# Patient Record
Sex: Female | Born: 1975 | Race: Black or African American | Hispanic: No | Marital: Married | Smoking: Never smoker
Health system: Southern US, Community
[De-identification: ages and names within clinical notes are randomized; demographics above are authoritative.]

## PROBLEM LIST (undated history)

## (undated) DIAGNOSIS — D649 Anemia, unspecified: Secondary | ICD-10-CM

## (undated) DIAGNOSIS — I2699 Other pulmonary embolism without acute cor pulmonale: Secondary | ICD-10-CM

## (undated) HISTORY — DX: Anemia, unspecified: D64.9

## (undated) HISTORY — DX: Other pulmonary embolism without acute cor pulmonale: I26.99

---

## 1998-05-01 ENCOUNTER — Emergency Department (HOSPITAL_COMMUNITY): Admission: EM | Admit: 1998-05-01 | Discharge: 1998-05-01 | Payer: Self-pay | Admitting: Emergency Medicine

## 1998-07-06 ENCOUNTER — Other Ambulatory Visit: Admission: RE | Admit: 1998-07-06 | Discharge: 1998-07-06 | Payer: Self-pay | Admitting: Obstetrics and Gynecology

## 1999-01-11 ENCOUNTER — Inpatient Hospital Stay (HOSPITAL_COMMUNITY): Admission: AD | Admit: 1999-01-11 | Discharge: 1999-01-14 | Payer: Self-pay | Admitting: Obstetrics & Gynecology

## 1999-01-15 ENCOUNTER — Encounter (HOSPITAL_COMMUNITY): Admission: RE | Admit: 1999-01-15 | Discharge: 1999-04-15 | Payer: Self-pay | Admitting: Obstetrics and Gynecology

## 1999-02-25 ENCOUNTER — Other Ambulatory Visit: Admission: RE | Admit: 1999-02-25 | Discharge: 1999-02-25 | Payer: Self-pay | Admitting: Obstetrics and Gynecology

## 2000-09-25 ENCOUNTER — Emergency Department (HOSPITAL_COMMUNITY): Admission: EM | Admit: 2000-09-25 | Discharge: 2000-09-25 | Payer: Self-pay | Admitting: Emergency Medicine

## 2001-05-01 ENCOUNTER — Inpatient Hospital Stay (HOSPITAL_COMMUNITY): Admission: AD | Admit: 2001-05-01 | Discharge: 2001-05-03 | Payer: Self-pay | Admitting: Gynecology

## 2002-02-06 ENCOUNTER — Emergency Department (HOSPITAL_COMMUNITY): Admission: EM | Admit: 2002-02-06 | Discharge: 2002-02-06 | Payer: Self-pay

## 2002-07-15 ENCOUNTER — Other Ambulatory Visit: Admission: RE | Admit: 2002-07-15 | Discharge: 2002-07-15 | Payer: Self-pay | Admitting: Gynecology

## 2002-11-08 ENCOUNTER — Emergency Department (HOSPITAL_COMMUNITY): Admission: EM | Admit: 2002-11-08 | Discharge: 2002-11-08 | Payer: Self-pay | Admitting: Emergency Medicine

## 2010-04-10 ENCOUNTER — Emergency Department (HOSPITAL_BASED_OUTPATIENT_CLINIC_OR_DEPARTMENT_OTHER): Admission: EM | Admit: 2010-04-10 | Discharge: 2010-04-10 | Payer: Self-pay | Admitting: Emergency Medicine

## 2010-04-10 ENCOUNTER — Ambulatory Visit: Payer: Self-pay | Admitting: Diagnostic Radiology

## 2016-06-18 ENCOUNTER — Emergency Department (HOSPITAL_COMMUNITY)
Admission: EM | Admit: 2016-06-18 | Discharge: 2016-06-18 | Disposition: A | Discharge status: Home / Self Care | Payer: Managed Care, Other (non HMO) | Attending: Emergency Medicine | Admitting: Emergency Medicine

## 2016-06-18 ENCOUNTER — Emergency Department (HOSPITAL_COMMUNITY): Payer: Managed Care, Other (non HMO)

## 2016-06-18 ENCOUNTER — Encounter (HOSPITAL_COMMUNITY): Payer: Self-pay | Admitting: Emergency Medicine

## 2016-06-18 DIAGNOSIS — W208XXA Other cause of strike by thrown, projected or falling object, initial encounter: Secondary | ICD-10-CM | POA: Insufficient documentation

## 2016-06-18 DIAGNOSIS — Z23 Encounter for immunization: Secondary | ICD-10-CM | POA: Diagnosis not present

## 2016-06-18 DIAGNOSIS — Y929 Unspecified place or not applicable: Secondary | ICD-10-CM | POA: Diagnosis not present

## 2016-06-18 DIAGNOSIS — S51811A Laceration without foreign body of right forearm, initial encounter: Secondary | ICD-10-CM | POA: Diagnosis present

## 2016-06-18 DIAGNOSIS — Y999 Unspecified external cause status: Secondary | ICD-10-CM | POA: Insufficient documentation

## 2016-06-18 DIAGNOSIS — Y939 Activity, unspecified: Secondary | ICD-10-CM | POA: Diagnosis not present

## 2016-06-18 DIAGNOSIS — T148XXA Other injury of unspecified body region, initial encounter: Secondary | ICD-10-CM

## 2016-06-18 MED ORDER — TETANUS-DIPHTH-ACELL PERTUSSIS 5-2.5-18.5 LF-MCG/0.5 IM SUSP
0.5000 mL | Freq: Once | INTRAMUSCULAR | Status: AC
  Administered 2016-06-18: 0.5 mL via INTRAMUSCULAR
  Filled 2016-06-18: qty 0.5

## 2016-06-18 MED ORDER — HYDROCODONE-ACETAMINOPHEN 5-325 MG PO TABS
2.0000 | ORAL_TABLET | Freq: Once | ORAL | Status: AC
  Administered 2016-06-18: 2 via ORAL
  Filled 2016-06-18: qty 2

## 2016-06-18 MED ORDER — BACITRACIN ZINC 500 UNIT/GM EX OINT
1.0000 "application " | TOPICAL_OINTMENT | Freq: Two times a day (BID) | CUTANEOUS | Status: DC
  Administered 2016-06-18: 1 via TOPICAL
  Filled 2016-06-18: qty 2.7

## 2016-06-18 NOTE — ED Triage Notes (Addendum)
Pt reports that she was trying to move a bedframe when it fell down on her right arm.  Bleeding is controlled by towel from home.  Wound is about 4 in long with visible adipose tissue.  Pt has limited ROM d/t pain but can wiggle fingers, full sensation, and strong pulses in her right hand.  Small shallow 1 in laceration on right hand.

## 2016-06-18 NOTE — ED Provider Notes (Signed)
[facility]-EMERGENCY DEPT Provider Note   CSN: [id number] Arrival date & time: 06/18/16  1849   By signing my name below, I, Lennie MuckleRyan Watts, attest that this documentation has been prepared under the direction and in the presence of Roxy Horsemanobert Kiona Blume, PA-C.  Electronically Signed: Lennie Muckleyan Watts, Scribe. 06/18/16. 7:43 PM.    History   Chief Complaint Chief Complaint  Patient presents with  . Extremity Laceration   HPI   HPI Comments: Carly Garrison is a 40 y.o. female who presents to the Emergency Department complaining of a wound on her right forearm. She was moving a bed frame and fell on her right arm. Has the bleeding controlled with a towel and says blood was not spraying from the wound. This happened within the last hour and she is in moderate pain.  History reviewed. No pertinent past medical history.  There are no active problems to display for this patient.   History reviewed. No pertinent surgical history.  OB History    No data available       Home Medications    Prior to Admission medications   Not on File    Family History No family history on file.  Social History Social History  Substance Use Topics  . Smoking status: Never Smoker  . Smokeless tobacco: Never Used  . Alcohol use Yes     Comment: wine, occasionally     Allergies   Review of patient's allergies indicates not on file.   Review of Systems Review of Systems  Skin:       Wound on right forearm, s/p fall     Physical Exam Updated Vital Signs BP 134/88 (BP Location: Left Arm)   Pulse 86   Temp 98.1 F (36.7 C) (Oral)   Resp 20   Ht 5\' 4"  (1.626 m)   Wt 167 lb (75.8 kg)   LMP 06/04/2016 Comment: 2 wks ago  SpO2 100%   BMI 28.67 kg/m   Physical Exam  Constitutional: She is oriented to person, place, and time. She appears well-developed and well-nourished.  HENT:  Head: Normocephalic and atraumatic.  Cardiovascular: Normal rate.   Pulmonary/Chest: Effort normal.  Neurological:  She is alert and oriented to person, place, and time.  Skin: Skin is warm and dry.  6cm linear avulsion injury to posterior right forearm without laceration, no bleeding, no foreign body  Psychiatric: She has a normal mood and affect.  Nursing note and vitals reviewed.    ED Treatments / Results  DIAGNOSTIC STUDIES: Oxygen Saturation is 100% on RA, normal by my interpretation.    COORDINATION OF CARE: 7:43 PM Discussed treatment plan with pt at bedside and pt agreed to plan.  Labs (all labs ordered are listed, but only abnormal results are displayed) Labs Reviewed - No data to display  EKG  EKG Interpretation None       Radiology Dg Forearm Right  Result Date: 06/18/2016 CLINICAL DATA:  Patient was moving furniture and dropped a bed frame on the right forearm EXAM: RIGHT FOREARM - 2 VIEW COMPARISON:  None. FINDINGS: There is no fracture identified. There is normal radial head alignment. Ossicle or old avulsion adjacent to the ulnar styloid. No radiopaque foreign body in the soft tissues. IMPRESSION: No acute osseous abnormality. Electronically Signed   By: Jasmine PangKim  Fujinaga M.D.   On: 06/18/2016 19:40    Procedures Procedures (including critical care time)  Medications Ordered in ED Medications  HYDROcodone-acetaminophen (NORCO/VICODIN) 5-325 MG per tablet 2 tablet (2 tablets  Oral Given 06/18/16 1932)  Tdap (BOOSTRIX) injection 0.5 mL (0.5 mLs Intramuscular Given 06/18/16 1930)     Initial Impression / Assessment and Plan / ED Course  I have reviewed the triage vital signs and the nursing notes.  Pertinent labs & imaging results that were available during my care of the patient were reviewed by me and considered in my medical decision making (see chart for details).  Clinical Course    Avulsion injury to skin, there is no laceration.  Will dress with bacitracin, Xeroform, gauze. Tetanus shot updated.    Final Clinical Impressions(s) / ED Diagnoses   Final  diagnoses:  Skin avulsion   I personally performed the services described in this documentation, which was scribed in my presence. The recorded information has been reviewed and is accurate.     New Prescriptions New Prescriptions   No medications on file     Roxy HorsemanRobert Anitta Tenny, PA-C 06/18/16 1957    Tilden FossaElizabeth Rees, MD 06/19/16 1501

## 2016-06-18 NOTE — ED Notes (Signed)
Attempted to use e-sign for discharge.  E-sign pad broken.

## 2019-11-14 ENCOUNTER — Ambulatory Visit: Payer: Managed Care, Other (non HMO) | Attending: Internal Medicine

## 2019-11-14 DIAGNOSIS — Z23 Encounter for immunization: Secondary | ICD-10-CM

## 2019-11-14 NOTE — Progress Notes (Signed)
   Covid-19 Vaccination Clinic  Name:  Carly Garrison    MRN: [id number] DOB: 1976/01/18  11/14/2019  [name] was observed post Covid-19 immunization for 15 minutes without incident. She was provided with Vaccine Information Sheet and instruction to access the V-Safe system.   [name] was instructed to call 911 with any severe reactions post vaccine: Marland Kitchen Difficulty breathing  . Swelling of face and throat  . A fast heartbeat  . A bad rash all over body  . Dizziness and weakness   Immunizations Administered    Name Date Dose VIS Date Route   Moderna COVID-19 Vaccine 11/14/2019  9:58 AM 0.5 mL 07/23/2019 Intramuscular   Manufacturer: Moderna   Lot: [id number]   NDC: 32992-426-83

## 2019-12-17 ENCOUNTER — Ambulatory Visit: Payer: Managed Care, Other (non HMO) | Attending: Internal Medicine

## 2019-12-17 DIAGNOSIS — Z23 Encounter for immunization: Secondary | ICD-10-CM

## 2019-12-17 NOTE — Progress Notes (Signed)
   Covid-19 Vaccination Clinic  Name:  Dlisa Barnwell    MRN: [id number] DOB: 1976/01/02  12/17/2019  Ms. Capers was observed post Covid-19 immunization for 15 minutes without incident. She was provided with Vaccine Information Sheet and instruction to access the V-Safe system.   Ms. Driskill was instructed to call 911 with any severe reactions post vaccine: Marland Kitchen Difficulty breathing  . Swelling of face and throat  . A fast heartbeat  . A bad rash all over body  . Dizziness and weakness   Immunizations Administered    Name Date Dose VIS Date Route   Moderna COVID-19 Vaccine 12/17/2019  9:36 AM 0.5 mL 07/2019 Intramuscular   Manufacturer: Moderna   Lot: [id number]   NDC: 00164-290-37

## 2020-02-11 ENCOUNTER — Other Ambulatory Visit: Payer: Self-pay

## 2020-02-11 ENCOUNTER — Ambulatory Visit
Admission: RE | Admit: 2020-02-11 | Discharge: 2020-02-11 | Disposition: A | Discharge status: Home / Self Care | Payer: BC Managed Care – PPO | Source: Ambulatory Visit

## 2020-02-11 ENCOUNTER — Telehealth: Payer: Self-pay | Admitting: Emergency Medicine

## 2020-02-11 ENCOUNTER — Emergency Department (HOSPITAL_COMMUNITY)
Admission: EM | Admit: 2020-02-11 | Discharge: 2020-02-11 | Disposition: A | Discharge status: Home / Self Care | Payer: BC Managed Care – PPO | Attending: Emergency Medicine | Admitting: Emergency Medicine

## 2020-02-11 DIAGNOSIS — Z5321 Procedure and treatment not carried out due to patient leaving prior to being seen by health care provider: Secondary | ICD-10-CM | POA: Insufficient documentation

## 2020-02-11 DIAGNOSIS — R42 Dizziness and giddiness: Secondary | ICD-10-CM | POA: Diagnosis not present

## 2020-02-11 NOTE — Telephone Encounter (Signed)
Late entry 11:44.    Attempted to contact patient using phone numbers in Epic.  Home number , had an answering machine.  Left a vague message to call this nurse No answer at work number No answer on cell phone.  "Onalee Hua" greeting on cell phone number, no message left.    Amy YU, PA notified of inability to contact patient.    1300 patient registered at [facility].  Spoke with patient about treatment modalities available and needed to rule out DVT.  Patient agreeable to recommendations.   Reports cell phone number she does not recognize, will delete.

## 2020-02-11 NOTE — ED Triage Notes (Signed)
Pt reports for a couple weeks had dizziness and when she crosses her legs and uncrosses them will have indents in her legs that last 10-15 seconds before go back to normal.

## 2020-11-10 ENCOUNTER — Ambulatory Visit (INDEPENDENT_AMBULATORY_CARE_PROVIDER_SITE_OTHER): Payer: BC Managed Care – PPO

## 2020-11-10 ENCOUNTER — Ambulatory Visit
Admission: EM | Admit: 2020-11-10 | Discharge: 2020-11-10 | Disposition: A | Discharge status: Home / Self Care | Payer: BC Managed Care – PPO | Attending: Physician Assistant | Admitting: Physician Assistant

## 2020-11-10 ENCOUNTER — Other Ambulatory Visit: Payer: Self-pay

## 2020-11-10 DIAGNOSIS — R1084 Generalized abdominal pain: Secondary | ICD-10-CM

## 2020-11-10 DIAGNOSIS — R079 Chest pain, unspecified: Secondary | ICD-10-CM

## 2020-11-10 NOTE — Discharge Instructions (Addendum)
You are scheduled for an ultrasound to evaluate your gallbladder

## 2020-11-10 NOTE — ED Triage Notes (Signed)
Pt presents with c/o pain under right rib cage and radiates to right flank, pt states pain is sharp at times, began 4 days ago, last bm a week ago. Pt states she has chronic constipation

## 2020-11-10 NOTE — ED Provider Notes (Signed)
[facility]    CSN: [id number] Arrival date & time: 11/10/20  7829      History   Chief Complaint Chief Complaint  Patient presents with  . Flank Pain    HPI Carly Garrison is a 45 y.o. female.   Pt complains of right chest pain.  Pain increases with a deep breath.   The history is provided by the patient. No language interpreter was used.  Flank Pain The current episode started yesterday. The problem occurs constantly. The problem has been gradually worsening. Nothing aggravates the symptoms. Nothing relieves the symptoms. She has tried nothing for the symptoms. The treatment provided no relief.    History reviewed. No pertinent past medical history.  There are no problems to display for this patient.   History reviewed. No pertinent surgical history.  OB History   No obstetric history on file.      Home Medications    Prior to Admission medications   Not on File    Family History Family History  Family history unknown: Yes    Social History Social History   Tobacco Use  . Smoking status: Never Smoker  . Smokeless tobacco: Never Used  Substance Use Topics  . Alcohol use: Yes    Comment: wine, occasionally  . Drug use: No     Allergies   Patient has no known allergies.   Review of Systems Review of Systems  Genitourinary: Positive for flank pain.  All other systems reviewed and are negative.    Physical Exam Triage Vital Signs ED Triage Vitals  Enc Vitals Group     BP 11/10/20 0828 128/68     Pulse Rate 11/10/20 0828 79     Resp 11/10/20 0828 18     Temp 11/10/20 0828 98.4 F (36.9 C)     Temp src --      SpO2 11/10/20 0828 98 %     Weight --      Height --      Head Circumference --      Peak Flow --      Pain Score 11/10/20 0826 8     Pain Loc --      Pain Edu? --      Excl. in GC? --    No data found.  Updated Vital Signs BP 128/68   Pulse 79   Temp 98.4 F (36.9 C)   Resp 18   LMP 10/19/2020   SpO2  98%   Visual Acuity Right Eye Distance:   Left Eye Distance:   Bilateral Distance:    Right Eye Near:   Left Eye Near:    Bilateral Near:     Physical Exam Vitals and nursing note reviewed.  Constitutional:      Appearance: She is well-developed.  HENT:     Head: Normocephalic.     Mouth/Throat:     Mouth: Mucous membranes are moist.  Cardiovascular:     Rate and Rhythm: Normal rate and regular rhythm.  Pulmonary:     Effort: Pulmonary effort is normal.  Abdominal:     General: There is no distension.  Musculoskeletal:        General: Normal range of motion.     Cervical back: Normal range of motion.  Skin:    General: Skin is warm.  Neurological:     General: No focal deficit present.     Mental Status: She is alert and oriented to person, place, and time.  Psychiatric:  Mood and Affect: Mood normal.      UC Treatments / Results  Labs (all labs ordered are listed, but only abnormal results are displayed) Labs Reviewed - No data to display  EKG   Radiology No results found.  Procedures Procedures (including critical care time)  Medications Ordered in UC Medications - No data to display  Initial Impression / Assessment and Plan / UC Course  I have reviewed the triage vital signs and the nursing notes.  Pertinent labs & imaging results that were available during my care of the patient were reviewed by me and considered in my medical decision making (see chart for details).     MDM:  Chest xray is normal  Pt is PERC negative.   Final Clinical Impressions(s) / UC Diagnoses   Final diagnoses:  Generalized abdominal pain   Discharge Instructions   None    ED Prescriptions    None     PDMP not reviewed this encounter.   Elson Areas, [place] 11/10/20 [phone]

## 2021-01-15 ENCOUNTER — Other Ambulatory Visit: Payer: Self-pay

## 2021-01-15 ENCOUNTER — Encounter (HOSPITAL_BASED_OUTPATIENT_CLINIC_OR_DEPARTMENT_OTHER): Payer: Self-pay | Admitting: *Deleted

## 2021-01-15 ENCOUNTER — Emergency Department (HOSPITAL_BASED_OUTPATIENT_CLINIC_OR_DEPARTMENT_OTHER): Payer: BC Managed Care – PPO

## 2021-01-15 ENCOUNTER — Inpatient Hospital Stay (HOSPITAL_BASED_OUTPATIENT_CLINIC_OR_DEPARTMENT_OTHER)
Admission: EM | Admit: 2021-01-15 | Discharge: 2021-01-18 | DRG: 176 | Disposition: A | Discharge status: Home / Self Care | Payer: BC Managed Care – PPO | Attending: Family Medicine | Admitting: Family Medicine

## 2021-01-15 DIAGNOSIS — I2694 Multiple subsegmental pulmonary emboli without acute cor pulmonale: Secondary | ICD-10-CM | POA: Diagnosis not present

## 2021-01-15 DIAGNOSIS — R0602 Shortness of breath: Secondary | ICD-10-CM | POA: Diagnosis not present

## 2021-01-15 DIAGNOSIS — I2699 Other pulmonary embolism without acute cor pulmonale: Secondary | ICD-10-CM | POA: Diagnosis present

## 2021-01-15 DIAGNOSIS — Z6834 Body mass index (BMI) 34.0-34.9, adult: Secondary | ICD-10-CM

## 2021-01-15 DIAGNOSIS — Z832 Family history of diseases of the blood and blood-forming organs and certain disorders involving the immune mechanism: Secondary | ICD-10-CM

## 2021-01-15 DIAGNOSIS — E66811 Obesity, class 1: Secondary | ICD-10-CM

## 2021-01-15 DIAGNOSIS — Z20822 Contact with and (suspected) exposure to covid-19: Secondary | ICD-10-CM | POA: Diagnosis present

## 2021-01-15 DIAGNOSIS — D72829 Elevated white blood cell count, unspecified: Secondary | ICD-10-CM

## 2021-01-15 DIAGNOSIS — E669 Obesity, unspecified: Secondary | ICD-10-CM | POA: Diagnosis present

## 2021-01-15 LAB — CBC WITH DIFFERENTIAL/PLATELET
Abs Immature Granulocytes: 0.03 10*3/uL (ref 0.00–0.07)
Basophils Absolute: 0 10*3/uL (ref 0.0–0.1)
Basophils Relative: 0 %
Eosinophils Absolute: 0 10*3/uL (ref 0.0–0.5)
Eosinophils Relative: 0 %
HCT: 38.2 % (ref 36.0–46.0)
Hemoglobin: 12.2 g/dL (ref 12.0–15.0)
Immature Granulocytes: 0 %
Lymphocytes Relative: 10 %
Lymphs Abs: 1.1 10*3/uL (ref 0.7–4.0)
MCH: 28.2 pg (ref 26.0–34.0)
MCHC: 31.9 g/dL (ref 30.0–36.0)
MCV: 88.4 fL (ref 80.0–100.0)
Monocytes Absolute: 0.6 10*3/uL (ref 0.1–1.0)
Monocytes Relative: 5 %
Neutro Abs: 9.6 10*3/uL — ABNORMAL HIGH (ref 1.7–7.7)
Neutrophils Relative %: 85 %
Platelets: 277 10*3/uL (ref 150–400)
RBC: 4.32 MIL/uL (ref 3.87–5.11)
RDW: 14.8 % (ref 11.5–15.5)
WBC: 11.3 10*3/uL — ABNORMAL HIGH (ref 4.0–10.5)
nRBC: 0 % (ref 0.0–0.2)

## 2021-01-15 LAB — LIPASE, BLOOD: Lipase: 16 U/L (ref 11–51)

## 2021-01-15 LAB — COMPREHENSIVE METABOLIC PANEL
ALT: 10 U/L (ref 0–44)
AST: 10 U/L — ABNORMAL LOW (ref 15–41)
Albumin: 4.2 g/dL (ref 3.5–5.0)
Alkaline Phosphatase: 62 U/L (ref 38–126)
Anion gap: 9 (ref 5–15)
BUN: 11 mg/dL (ref 6–20)
CO2: 22 mmol/L (ref 22–32)
Calcium: 9.2 mg/dL (ref 8.9–10.3)
Chloride: 103 mmol/L (ref 98–111)
Creatinine, Ser: 0.77 mg/dL (ref 0.44–1.00)
GFR, Estimated: >60 mL/min (ref >60)
Glucose, Bld: 103 mg/dL — ABNORMAL HIGH (ref 70–99)
Potassium: 4 mmol/L (ref 3.5–5.1)
Sodium: 134 mmol/L — ABNORMAL LOW (ref 135–145)
Total Bilirubin: 0.4 mg/dL (ref 0.3–1.2)
Total Protein: 8.5 g/dL — ABNORMAL HIGH (ref 6.5–8.1)

## 2021-01-15 LAB — TROPONIN I (HIGH SENSITIVITY)
Troponin I (High Sensitivity): <2 ng/L (ref <18)
Troponin I (High Sensitivity): <2 ng/L (ref <18)

## 2021-01-15 LAB — RESP PANEL BY RT-PCR (FLU A&B, COVID) ARPGX2
Influenza A by PCR: NEGATIVE
Influenza B by PCR: NEGATIVE
SARS Coronavirus 2 by RT PCR: NEGATIVE

## 2021-01-15 LAB — D-DIMER, QUANTITATIVE: D-Dimer, Quant: 1.8 ug/mL-FEU — ABNORMAL HIGH (ref 0.00–0.50)

## 2021-01-15 MED ORDER — ONDANSETRON HCL 4 MG/2ML IJ SOLN
4.0000 mg | Freq: Once | INTRAMUSCULAR | Status: AC
  Administered 2021-01-15: 4 mg via INTRAVENOUS
  Filled 2021-01-15: qty 2

## 2021-01-15 MED ORDER — ALBUTEROL SULFATE HFA 108 (90 BASE) MCG/ACT IN AERS: 2.0000 | INHALATION_SPRAY | RESPIRATORY_TRACT | Status: DC | PRN

## 2021-01-15 MED ORDER — HEPARIN SODIUM (PORCINE) 5000 UNIT/ML IJ SOLN: 4000.0000 [IU] | Freq: Once | INTRAMUSCULAR | Status: DC

## 2021-01-15 MED ORDER — HEPARIN (PORCINE) 25000 UT/250ML-% IV SOLN: 14.0000 [IU]/kg/h | INTRAVENOUS | Status: DC

## 2021-01-15 MED ORDER — HEPARIN (PORCINE) 25000 UT/250ML-% IV SOLN
1550.0000 [IU]/h | INTRAVENOUS | Status: AC
  Administered 2021-01-15 (×2): 1550 [IU]/h via INTRAVENOUS
  Administered 2021-01-16: 1350 [IU]/h via INTRAVENOUS
  Administered 2021-01-16: 1450 [IU]/h via INTRAVENOUS
  Administered 2021-01-17: 1550 [IU]/h via INTRAVENOUS
  Filled 2021-01-15 (×4): qty 250

## 2021-01-15 MED ORDER — HEPARIN BOLUS VIA INFUSION
5000.0000 [IU] | Freq: Once | INTRAVENOUS | Status: AC
  Administered 2021-01-15: 5000 [IU] via INTRAVENOUS

## 2021-01-15 MED ORDER — IOHEXOL 350 MG/ML SOLN
80.0000 mL | Freq: Once | INTRAVENOUS | Status: AC | PRN
  Administered 2021-01-15: 80 mL via INTRAVENOUS

## 2021-01-15 MED ORDER — HYDROMORPHONE HCL 1 MG/ML IJ SOLN
1.0000 mg | Freq: Once | INTRAMUSCULAR | Status: AC
  Administered 2021-01-15: 1 mg via INTRAVENOUS
  Filled 2021-01-15: qty 1

## 2021-01-15 NOTE — ED Notes (Signed)
Patient states woke up last night with acute Right side rib pain radiating to right back.  Hurts worse with deep breathing and movement.

## 2021-01-15 NOTE — ED Provider Notes (Signed)
[facility] EMERGENCY DEPT Provider Note   CSN: [id number] Arrival date & time: 01/15/21  1305     History Chief Complaint  Patient presents with  . Chest Pain  . Shortness of Breath    Carly Garrison is a 45 y.o. female.  Patient with acute onset of right-sided chest pain kind of underneath the right breast.  Kind of radiates around to the side.  It is worse with taking a deep breath.  Patient's never had anything like this before.  Past medical history noncontributory no history of diabetes hypertension.  States there is a family history of blood clots in the lungs.  Patient not on any birth control.  Patient denies any leg swelling        History reviewed. No pertinent past medical history.  There are no problems to display for this patient.   History reviewed. No pertinent surgical history.   OB History   No obstetric history on file.     Family History  Family history unknown: Yes    Social History   Tobacco Use  . Smoking status: Never Smoker  . Smokeless tobacco: Never Used  Substance Use Topics  . Alcohol use: Not Currently    Comment: wine, occasionally  . Drug use: No    Home Medications Prior to Admission medications   Not on File    Allergies    Patient has no known allergies.  Review of Systems   Review of Systems  Constitutional: Negative for chills and fever.  HENT: Negative for rhinorrhea and sore throat.   Eyes: Negative for visual disturbance.  Respiratory: Negative for cough and shortness of breath.   Cardiovascular: Positive for chest pain. Negative for palpitations and leg swelling.  Gastrointestinal: Negative for abdominal pain, diarrhea, nausea and vomiting.  Genitourinary: Negative for dysuria.  Musculoskeletal: Negative for back pain and neck pain.  Skin: Negative for rash.  Neurological: Negative for dizziness, light-headedness and headaches.  Hematological: Does not bruise/bleed easily.   Psychiatric/Behavioral: Negative for confusion.    Physical Exam Updated Vital Signs BP 131/86 (BP Location: Right Arm)   Pulse 92   Temp 98.5 F (36.9 C) (Oral)   Resp (!) 23   Ht 1.651 m (5\' 5" )   Wt 93 kg   SpO2 99%   BMI 34.11 kg/m   Physical Exam Vitals and nursing note reviewed.  Constitutional:      General: She is not in acute distress.    Appearance: Normal appearance. She is well-developed.  HENT:     Head: Normocephalic and atraumatic.  Eyes:     Extraocular Movements: Extraocular movements intact.     Conjunctiva/sclera: Conjunctivae normal.     Pupils: Pupils are equal, round, and reactive to light.  Cardiovascular:     Rate and Rhythm: Normal rate and regular rhythm.     Heart sounds: No murmur heard.   Pulmonary:     Effort: Pulmonary effort is normal. No respiratory distress.     Breath sounds: Normal breath sounds.     Comments: Tenderness to palpation under the right breast area. Chest:     Chest wall: Tenderness present.  Abdominal:     Palpations: Abdomen is soft.     Tenderness: There is no abdominal tenderness.     Comments: No tenderness to palpation in the abdomen in particular the right upper quadrant or epigastric area.  Musculoskeletal:        General: No swelling. Normal range of motion.  Cervical back: Normal range of motion and neck supple.  Skin:    General: Skin is warm and dry.     Capillary Refill: Capillary refill takes less than 2 seconds.  Neurological:     General: No focal deficit present.     Mental Status: She is alert and oriented to person, place, and time.     Cranial Nerves: No cranial nerve deficit.     Sensory: No sensory deficit.     Motor: No weakness.     ED Results / Procedures / Treatments   Labs (all labs ordered are listed, but only abnormal results are displayed) Labs Reviewed  CBC WITH DIFFERENTIAL/PLATELET - Abnormal; Notable for the following components:      Result Value   WBC 11.3 (*)     Neutro Abs 9.6 (*)    All other components within normal limits  COMPREHENSIVE METABOLIC PANEL - Abnormal; Notable for the following components:   Sodium 134 (*)    Glucose, Bld 103 (*)    Total Protein 8.5 (*)    AST 10 (*)    All other components within normal limits  D-DIMER, QUANTITATIVE - Abnormal; Notable for the following components:   D-Dimer, Quant 1.80 (*)    All other components within normal limits  RESP PANEL BY RT-PCR (FLU A&B, COVID) ARPGX2  LIPASE, BLOOD  TROPONIN I (HIGH SENSITIVITY)  TROPONIN I (HIGH SENSITIVITY)    EKG EKG Interpretation  Date/Time:  Friday Jan 15 2021 13:29:24 EDT Ventricular Rate:  94 PR Interval:  120 QRS Duration: 72 QT Interval:  336 QTC Calculation: 420 R Axis:   61 Text Interpretation: Normal sinus rhythm Normal ECG No previous ECGs available Confirmed by Vanetta Mulders [phone]) on 01/15/2021 6:45:47 PM   Radiology DG Chest 2 View  Result Date: 01/15/2021 CLINICAL DATA:  Shortness of breath, RIGHT rib pain, worsening symptoms EXAM: CHEST - 2 VIEW COMPARISON:  11/10/2020 FINDINGS: Normal heart size, mediastinal contours, and pulmonary vascularity. Lungs clear. No pleural effusion or pneumothorax. Bones unremarkable. IMPRESSION: Normal exam. Electronically Signed   By: Ulyses Southward M.D.   On: 01/15/2021 14:09   CT Angio Chest PE W/Cm &/Or Wo Cm  Result Date: 01/15/2021 CLINICAL DATA:  Right rib pain and short of breath EXAM: CT ANGIOGRAPHY CHEST WITH CONTRAST TECHNIQUE: Multidetector CT imaging of the chest was performed using the standard protocol during bolus administration of intravenous contrast. Multiplanar CT image reconstructions and MIPs were obtained to evaluate the vascular anatomy. CONTRAST:  58mL OMNIPAQUE IOHEXOL 350 MG/ML SOLN COMPARISON:  Chest x-ray 01/15/2021 FINDINGS: Cardiovascular: Satisfactory opacification of the pulmonary arteries to the segmental level. Small subsegmental emboli within the right upper lobe, series 5,  image 73. Several subsegmental emboli also evident within subsegmental right lower lobe pulmonary vessels, series 5, image 152. Aorta is nonaneurysmal. Cardiac size within normal limits. No pericardial effusion Mediastinum/Nodes: No enlarged mediastinal, hilar, or axillary lymph nodes. Thyroid gland, trachea, and esophagus demonstrate no significant findings. Lungs/Pleura: No pleural effusion or pneumothorax. Hazy right greater than left pulmonary densities at the bases, favor atelectasis, less likely infarct on the right side. Upper Abdomen: No acute abnormality. Musculoskeletal: No chest wall abnormality. No acute or significant osseous findings. Review of the MIP images confirms the above findings. IMPRESSION: 1. Positive for acute subsegmental emboli within the right upper and lower lobes. 2. Hazy right greater than left lower lobe airspace disease, favored to represent atelectasis, though small infarcts on the right could also be considered. Critical  Value/emergent results were called by telephone at the time of interpretation on 01/15/2021 at 8:37 pm to provider Vanetta Mulders , who verbally acknowledged these results. Electronically Signed   By: Jasmine Pang M.D.   On: 01/15/2021 20:37    Procedures Procedures   CRITICAL CARE Performed by: Vanetta Mulders Total critical care time: 45 minutes Critical care time was exclusive of separately billable procedures and treating other patients. Critical care was necessary to treat or prevent imminent or life-threatening deterioration. Critical care was time spent personally by me on the following activities: development of treatment plan with patient and/or surrogate as well as nursing, discussions with consultants, evaluation of patient's response to treatment, examination of patient, obtaining history from patient or surrogate, ordering and performing treatments and interventions, ordering and review of laboratory studies, ordering and review of  radiographic studies, pulse oximetry and re-evaluation of patient's condition.  Medications Ordered in ED Medications  heparin injection 4,000 Units (has no administration in time range)  heparin ADULT infusion 100 units/mL (25000 units/229mL) (has no administration in time range)  iohexol (OMNIPAQUE) 350 MG/ML injection 80 mL (80 mLs Intravenous Contrast Given 01/15/21 2014)    ED Course  I have reviewed the triage vital signs and the nursing notes.  Pertinent labs & imaging results that were available during my care of the patient were reviewed by me and considered in my medical decision making (see chart for details).    MDM Rules/Calculators/A&P                          Clinically suspicious for pleuritic chest pain.  PE was a possibility.  Patient without any hypoxia.  Oxygen saturations on room air 100%.  Respiratory rate up a little bit at 23 heart rate around 82.  Blood pressures have been 125 systolic.  White blood cell count 11,000.  Second troponin pending.  Complete metabolic panel without any significant liver function test abnormalities.  Lipase was normal.  Renal function is normal.  Initial troponin was less than 2.  D-dimer was 1.80.  Based on that CT angio chest was ordered.  Patient's regular chest x-ray without any acute findings.  EKG is normal sinus rhythm.  COVID testing is pending  Patient will be started on heparin with heparin bolus and then heparin drip.  Patient hemodynamically stable  CT angio was positive for acute segmental emboli within the right upper and lower lobes.  No evidence of any right heart strain.  We will start the patient on heparin.  Will contact hospitalist for admission.    Final Clinical Impression(s) / ED Diagnoses Final diagnoses:  Multiple subsegmental pulmonary emboli without acute cor pulmonale [facility])    Rx / DC Orders ED Discharge Orders    None       Vanetta Mulders, MD 01/15/21 2102

## 2021-01-15 NOTE — ED Triage Notes (Signed)
About 0200, pt was waken out of sleep with rt rib pain and shob. Pain has become worse. No other symptoms

## 2021-01-15 NOTE — Progress Notes (Signed)
ANTICOAGULATION CONSULT NOTE - Initial Consult  Pharmacy Consult for heparin Indication: pulmonary embolus  No Known Allergies  Patient Measurements: Height: 5\' 5"  (165.1 cm) Weight: 93 kg (205 lb) IBW/kg (Calculated) : 57 Heparin Dosing Weight: 93 kg   Vital Signs: Temp: 98.5 F (36.9 C) (05/27 1318) Temp Source: Oral (05/27 1318) BP: 131/86 (05/27 2000) Pulse Rate: 92 (05/27 2000)  Labs: Recent Labs    01/15/21 1913  HGB 12.2  HCT 38.2  PLT 277  CREATININE 0.77  TROPONINIHS <2    Estimated Creatinine Clearance: 102.2 mL/min (by C-G formula based on SCr of 0.77 mg/dL).   Medical History: History reviewed. No pertinent past medical history.  Medications:  (Not in a hospital admission)   Assessment: 38 YOF with acute subsegmental pulmonary emboli to start IV heparin. H/H and Plt wnl. SCr wnl  Goal of Therapy:  Heparin level 0.3-0.7 units/ml Monitor platelets by anticoagulation protocol: Yes   Plan:  -Heparin 5000 units IV bolus followed by heparin infusion at 1550 units/hr -F/u 6 hr HL -Monitor daily HL, CBC and s/s of bleeding   56, PharmD., BCPS, BCCCP Clinical Pharmacist Please refer to [facility] for unit-specific pharmacist

## 2021-01-16 ENCOUNTER — Observation Stay (HOSPITAL_BASED_OUTPATIENT_CLINIC_OR_DEPARTMENT_OTHER): Payer: BC Managed Care – PPO

## 2021-01-16 ENCOUNTER — Encounter (HOSPITAL_COMMUNITY): Payer: Self-pay | Admitting: Family Medicine

## 2021-01-16 DIAGNOSIS — R0602 Shortness of breath: Secondary | ICD-10-CM | POA: Diagnosis present

## 2021-01-16 DIAGNOSIS — Z20822 Contact with and (suspected) exposure to covid-19: Secondary | ICD-10-CM | POA: Diagnosis not present

## 2021-01-16 DIAGNOSIS — Z6834 Body mass index (BMI) 34.0-34.9, adult: Secondary | ICD-10-CM | POA: Diagnosis not present

## 2021-01-16 DIAGNOSIS — E669 Obesity, unspecified: Secondary | ICD-10-CM | POA: Diagnosis not present

## 2021-01-16 DIAGNOSIS — Z832 Family history of diseases of the blood and blood-forming organs and certain disorders involving the immune mechanism: Secondary | ICD-10-CM | POA: Diagnosis not present

## 2021-01-16 DIAGNOSIS — D72829 Elevated white blood cell count, unspecified: Secondary | ICD-10-CM

## 2021-01-16 DIAGNOSIS — I2699 Other pulmonary embolism without acute cor pulmonale: Secondary | ICD-10-CM | POA: Diagnosis not present

## 2021-01-16 DIAGNOSIS — I2694 Multiple subsegmental pulmonary emboli without acute cor pulmonale: Secondary | ICD-10-CM | POA: Diagnosis not present

## 2021-01-16 LAB — HEPARIN LEVEL (UNFRACTIONATED)
Heparin Unfractionated: 0.84 IU/mL — ABNORMAL HIGH (ref 0.30–0.70)
Heparin Unfractionated: 0.41 [IU]/mL (ref 0.30–0.70)
Heparin Unfractionated: 0.26 IU/mL — ABNORMAL LOW (ref 0.30–0.70)

## 2021-01-16 LAB — CBC
HCT: 35.9 % — ABNORMAL LOW (ref 36.0–46.0)
Hemoglobin: 11.4 g/dL — ABNORMAL LOW (ref 12.0–15.0)
MCH: 28.1 pg (ref 26.0–34.0)
MCHC: 31.8 g/dL (ref 30.0–36.0)
MCV: 88.6 fL (ref 80.0–100.0)
Platelets: 266 10*3/uL (ref 150–400)
RBC: 4.05 MIL/uL (ref 3.87–5.11)
RDW: 14.8 % (ref 11.5–15.5)
WBC: 11.2 10*3/uL — ABNORMAL HIGH (ref 4.0–10.5)
nRBC: 0 % (ref 0.0–0.2)

## 2021-01-16 LAB — HIV ANTIBODY (ROUTINE TESTING W REFLEX): HIV Screen 4th Generation wRfx: NONREACTIVE

## 2021-01-16 LAB — ANTITHROMBIN III: AntiThromb III Func: 97 % (ref 75–120)

## 2021-01-16 MED ORDER — HYDROMORPHONE HCL 1 MG/ML IJ SOLN
0.5000 mg | Freq: Once | INTRAMUSCULAR | Status: AC
  Administered 2021-01-16: 0.5 mg via INTRAVENOUS
  Filled 2021-01-16: qty 0.5

## 2021-01-16 MED ORDER — HEPARIN BOLUS VIA INFUSION
4000.0000 [IU] | Freq: Once | INTRAVENOUS | Status: AC
  Administered 2021-01-16: 4000 [IU] via INTRAVENOUS
  Filled 2021-01-16: qty 4000

## 2021-01-16 MED ORDER — ACETAMINOPHEN 650 MG RE SUPP: 650.0000 mg | Freq: Four times a day (QID) | RECTAL | Status: DC | PRN

## 2021-01-16 MED ORDER — ACETAMINOPHEN 325 MG PO TABS
650.0000 mg | ORAL_TABLET | Freq: Four times a day (QID) | ORAL | Status: DC | PRN
  Administered 2021-01-16 – 2021-01-17 (×2): 650 mg via ORAL
  Filled 2021-01-16 (×2): qty 2

## 2021-01-16 MED ORDER — KETOROLAC TROMETHAMINE 15 MG/ML IJ SOLN
15.0000 mg | Freq: Three times a day (TID) | INTRAMUSCULAR | Status: AC | PRN
  Administered 2021-01-16 (×3): 15 mg via INTRAVENOUS
  Filled 2021-01-16 (×3): qty 1

## 2021-01-16 NOTE — Plan of Care (Signed)
  Problem: Education: Goal: Knowledge of General Education information will improve Description: Including pain rating scale, medication(s)/side effects and non-pharmacologic comfort measures 01/16/2021 0302 by Llana Aliment D, RN Outcome: Progressing 01/16/2021 0301 by Llana Aliment D, RN Outcome: Progressing 01/16/2021 0301 by Annitta Jersey, RN Outcome: Progressing   Problem: Health Behavior/Discharge Planning: Goal: Ability to manage health-related needs will improve 01/16/2021 0302 by Annitta Jersey, RN Outcome: Progressing 01/16/2021 0301 by Llana Aliment D, RN Outcome: Progressing 01/16/2021 0301 by Llana Aliment D, RN Outcome: Progressing   Problem: Clinical Measurements: Goal: Ability to maintain clinical measurements within normal limits will improve 01/16/2021 0302 by Llana Aliment D, RN Outcome: Progressing 01/16/2021 0301 by Llana Aliment D, RN Outcome: Progressing 01/16/2021 0301 by Llana Aliment D, RN Outcome: Progressing Goal: Diagnostic test results will improve 01/16/2021 0302 by Llana Aliment D, RN Outcome: Progressing 01/16/2021 0301 by Llana Aliment D, RN Outcome: Progressing 01/16/2021 0301 by Llana Aliment D, RN Outcome: Progressing Goal: Respiratory complications will improve 01/16/2021 0302 by Llana Aliment D, RN Outcome: Progressing 01/16/2021 0301 by Llana Aliment D, RN Outcome: Progressing 01/16/2021 0301 by Annitta Jersey, RN Outcome: Progressing   Problem: Activity: Goal: Risk for activity intolerance will decrease 01/16/2021 0302 by Llana Aliment D, RN Outcome: Progressing 01/16/2021 0301 by Llana Aliment D, RN Outcome: Progressing 01/16/2021 0301 by Llana Aliment D, RN Outcome: Progressing   Problem: Pain Managment: Goal: General experience of comfort will improve 01/16/2021 0302 by Llana Aliment D, RN Outcome: Progressing 01/16/2021 0301 by Llana Aliment D, RN Outcome: Progressing 01/16/2021 0301 by Llana Aliment D,  RN Outcome: Progressing   Problem: Safety: Goal: Ability to remain free from injury will improve 01/16/2021 0302 by Llana Aliment D, RN Outcome: Progressing 01/16/2021 0301 by Llana Aliment D, RN Outcome: Progressing 01/16/2021 0301 by Llana Aliment D, RN Outcome: Progressing   Problem: Skin Integrity: Goal: Risk for impaired skin integrity will decrease 01/16/2021 0302 by Llana Aliment D, RN Outcome: Progressing 01/16/2021 0301 by Llana Aliment D, RN Outcome: Progressing 01/16/2021 0301 by Annitta Jersey, RN Outcome: Progressing

## 2021-01-16 NOTE — Care Plan (Signed)
This 45 years old female with no significant PMH other than obesity presents in the ED with c/o: acute onset of right-sided pleuritic chest pain associated with shortness of breath.  In the ED D-dimer was slightly elevated,  CT angiogram chest reveals acute segmental pulmonary emboli within the right upper and lower lobes.  No evidence of right heart strain.Patient was started on IV heparin.  Patient reports significant family history of blood clots in the sister and mother.  Patient was seen at bedside,  lying comfortably,  denies any chest pain or shortness of breath.  Will consider transitioning heparin to Eliquis in 24 to 48 hours.

## 2021-01-16 NOTE — Progress Notes (Signed)
Bilateral lower extremity venous duplex has been completed. Preliminary results can be found in CV Proc through chart review.   01/16/21 9:18 AM Olen Cordial RVT

## 2021-01-16 NOTE — H&P (Signed)
History and Physical    Carly Garrison SXJ:[id number] DOB: 09/01/1975 DOA: 01/15/2021  PCP: Patient, No Pcp Per (Inactive) Patient coming from: [facility]  Chief Complaint: Chest pain, shortness of breath  HPI: Carly Garrison is a 45 y.o. female with no significant past medical history other than obesity (BMI 34.11) presented to the ED with complaints of acute onset right-sided pleuritic chest pain and dyspnea.  In the ED, patient was slightly tachypneic but not hypoxic.  Afebrile and not tachycardic.  Not hypotensive.  Labs showing WBC 11.3, hemoglobin 12.2, platelet count 277K.  Sodium 134, potassium 4.0, chloride 103, bicarb 22, BUN 11, creatinine 0.7, glucose 103.  No elevation of lipase and LFTs.  High-sensitivity troponin negative x2.  D-dimer elevated at 1.8.  COVID and influenza PCR negative.  Chest x-ray showing no active disease.  CT angiogram chest showing acute segmental pulmonary emboli within the right upper and lower lobes.  No evidence of right heart strain.  Also showing hazy right greater than left lower lobe airspace disease suspicious for atelectasis versus small infarcts on the right.  Patient was started on IV heparin.  Patient reports experiencing acute right-sided pleuritic chest pain associated with dyspnea with started at 2 AM yesterday morning and woke her up from her sleep.  Denies fevers or cough.  Denies prior history of blood clots.  Denies history of leg pain or swelling.  She does not have any medical conditions and does not take any medications.  She does not smoke cigarettes.  She is not on an oral contraceptive pill.  No recent surgeries or immobility.  No recent travel.  Patient does state that both her father and sister have had blood clots.  Sister had blood clots twice when she was in her early 52s.  Review of Systems:  All systems reviewed and apart from history of presenting illness, are negative.  History reviewed. No pertinent past medical  history.  History reviewed. No pertinent surgical history.   reports that she has never smoked. She has never used smokeless tobacco. She reports previous alcohol use. She reports that she does not use drugs.  No Known Allergies  Family History  Problem Relation Age of Onset  . Clotting disorder Father   . Clotting disorder Sister     Prior to Admission medications   Not on File    Physical Exam: Vitals:   01/15/21 1930 01/15/21 2000 01/16/21 0100 01/16/21 0218  BP: 133/90 131/86 117/79 135/89  Pulse: 89 92 85 83  Resp: (!) 22 (!) 23 20 (!) 22  Temp:    98.7 F (37.1 C)  TempSrc:    Oral  SpO2: 100% 99% 95% 95%  Weight:      Height:        Physical Exam Constitutional:      General: She is not in acute distress. HENT:     Head: Normocephalic and atraumatic.  Eyes:     Extraocular Movements: Extraocular movements intact.     Conjunctiva/sclera: Conjunctivae normal.  Cardiovascular:     Rate and Rhythm: Normal rate and regular rhythm.     Pulses: Normal pulses.  Pulmonary:     Effort: Pulmonary effort is normal. No respiratory distress.     Breath sounds: Rales present. No wheezing.  Abdominal:     General: Bowel sounds are normal. There is no distension.     Palpations: Abdomen is soft.     Tenderness: There is no abdominal tenderness.  Musculoskeletal:  General: No swelling or tenderness.     Cervical back: Normal range of motion and neck supple.  Skin:    General: Skin is warm and dry.  Neurological:     General: No focal deficit present.     Mental Status: She is alert and oriented to person, place, and time.     Labs on Admission: I have personally reviewed following labs and imaging studies  CBC: Recent Labs  Lab 01/15/21 1913 01/16/21 0347  WBC 11.3* 11.2*  NEUTROABS 9.6*  --   HGB 12.2 11.4*  HCT 38.2 35.9*  MCV 88.4 88.6  PLT 277 266   Basic Metabolic Panel: Recent Labs  Lab 01/15/21 1913  NA 134*  K 4.0  CL 103  CO2 22   GLUCOSE 103*  BUN 11  CREATININE 0.77  CALCIUM 9.2   GFR: Estimated Creatinine Clearance: 102.2 mL/min (by C-G formula based on SCr of 0.77 mg/dL). Liver Function Tests: Recent Labs  Lab 01/15/21 1913  AST 10*  ALT 10  ALKPHOS 62  BILITOT 0.4  PROT 8.5*  ALBUMIN 4.2   Recent Labs  Lab 01/15/21 1913  LIPASE 16   No results for input(s): AMMONIA in the last 168 hours. Coagulation Profile: No results for input(s): INR, PROTIME in the last 168 hours. Cardiac Enzymes: No results for input(s): CKTOTAL, CKMB, CKMBINDEX, TROPONINI in the last 168 hours. BNP (last 3 results) No results for input(s): PROBNP in the last 8760 hours. HbA1C: No results for input(s): HGBA1C in the last 72 hours. CBG: No results for input(s): GLUCAP in the last 168 hours. Lipid Profile: No results for input(s): CHOL, HDL, LDLCALC, TRIG, CHOLHDL, LDLDIRECT in the last 72 hours. Thyroid Function Tests: No results for input(s): TSH, T4TOTAL, FREET4, T3FREE, THYROIDAB in the last 72 hours. Anemia Panel: No results for input(s): VITAMINB12, FOLATE, FERRITIN, TIBC, IRON, RETICCTPCT in the last 72 hours. Urine analysis: No results found for: COLORURINE, APPEARANCEUR, LABSPEC, PHURINE, GLUCOSEU, HGBUR, BILIRUBINUR, KETONESUR, PROTEINUR, UROBILINOGEN, NITRITE, LEUKOCYTESUR  Radiological Exams on Admission: DG Chest 2 View  Result Date: 01/15/2021 CLINICAL DATA:  Shortness of breath, RIGHT rib pain, worsening symptoms EXAM: CHEST - 2 VIEW COMPARISON:  11/10/2020 FINDINGS: Normal heart size, mediastinal contours, and pulmonary vascularity. Lungs clear. No pleural effusion or pneumothorax. Bones unremarkable. IMPRESSION: Normal exam. Electronically Signed   By: Ulyses Southward M.D.   On: 01/15/2021 14:09   CT Angio Chest PE W/Cm &/Or Wo Cm  Result Date: 01/15/2021 CLINICAL DATA:  Right rib pain and short of breath EXAM: CT ANGIOGRAPHY CHEST WITH CONTRAST TECHNIQUE: Multidetector CT imaging of the chest was  performed using the standard protocol during bolus administration of intravenous contrast. Multiplanar CT image reconstructions and MIPs were obtained to evaluate the vascular anatomy. CONTRAST:  45mL OMNIPAQUE IOHEXOL 350 MG/ML SOLN COMPARISON:  Chest x-ray 01/15/2021 FINDINGS: Cardiovascular: Satisfactory opacification of the pulmonary arteries to the segmental level. Small subsegmental emboli within the right upper lobe, series 5, image 73. Several subsegmental emboli also evident within subsegmental right lower lobe pulmonary vessels, series 5, image 152. Aorta is nonaneurysmal. Cardiac size within normal limits. No pericardial effusion Mediastinum/Nodes: No enlarged mediastinal, hilar, or axillary lymph nodes. Thyroid gland, trachea, and esophagus demonstrate no significant findings. Lungs/Pleura: No pleural effusion or pneumothorax. Hazy right greater than left pulmonary densities at the bases, favor atelectasis, less likely infarct on the right side. Upper Abdomen: No acute abnormality. Musculoskeletal: No chest wall abnormality. No acute or significant osseous findings. Review of the  MIP images confirms the above findings. IMPRESSION: 1. Positive for acute subsegmental emboli within the right upper and lower lobes. 2. Hazy right greater than left lower lobe airspace disease, favored to represent atelectasis, though small infarcts on the right could also be considered. Critical Value/emergent results were called by telephone at the time of interpretation on 01/15/2021 at 8:37 pm to provider Vanetta Mulders , who verbally acknowledged these results. Electronically Signed   By: Jasmine Pang M.D.   On: 01/15/2021 20:37    EKG: Independently reviewed.  Sinus rhythm, nonspecific T wave abnormality.  No prior tracing for comparison.  Assessment/Plan Principal Problem:   Acute pulmonary embolism (HCC) Active Problems:   Leukocytosis   Obesity   Acute segmental PE Patient presenting with complaints of  acute onset right-sided pleuritic chest pain and dyspnea.  D-dimer elevated and CT angiogram chest showing acute segmental pulmonary emboli within the right upper and lower lobes.  No evidence of right heart strain.  Also showing hazy right greater than left lower lobe airspace disease suspicious for atelectasis versus small infarcts on the right.  Patient is hemodynamically stable and not hypoxic.  Does report strong family history of blood clots including father and sister. -Continue IV heparin with plan to transition to DOAC prior to discharge.  Toradol as needed for pleuritic chest pain, Tylenol as needed.  Continuous pulse ox, supplemental oxygen as needed.  Bilateral lower extremity Dopplers ordered.  Hypercoagulable panel ordered.  Borderline leukocytosis Likely reactive.  No infectious signs or symptoms. -Continue to monitor  Obesity (BMI 34.11) -Encourage lifestyle modifications including healthy diet and exercise  DVT prophylaxis: Heparin Code Status: Full code Family Communication: No family at bedside. Disposition Plan: Status is: Observation  The patient remains OBS appropriate and will d/c before 2 midnights.  Dispo: The patient is from: Home              Anticipated d/c is to: Home              Patient currently is not medically stable to d/c.   Difficult to place patient No   Level of care: Level of care: Telemetry   The medical decision making on this patient was of high complexity and the patient is at high risk for clinical deterioration, therefore this is a level 3 visit.  John Giovanni MD [facility]  If 7PM-7AM, please contact night-coverage [email or web address]  01/16/2021, 5:36 AM

## 2021-01-16 NOTE — Progress Notes (Signed)
ANTICOAGULATION CONSULT NOTE - follow up  Pharmacy Consult for heparin Indication: pulmonary embolus  No Known Allergies  Patient Measurements: Height: 5\' 5"  (165.1 cm) Weight: 93 kg (205 lb) IBW/kg (Calculated) : 57 Heparin Dosing Weight: 93 kg   Vital Signs: Temp: 98.7 F (37.1 C) (05/28 0218) Temp Source: Oral (05/28 0218) BP: 135/89 (05/28 0218) Pulse Rate: 83 (05/28 0218)  Labs: Recent Labs    01/15/21 1913 01/15/21 2046 01/16/21 0347  HGB 12.2  --  11.4*  HCT 38.2  --  35.9*  PLT 277  --  266  HEPARINUNFRC  --   --  0.84*  CREATININE 0.77  --   --   TROPONINIHS <2 <2  --     Estimated Creatinine Clearance: 102.2 mL/min (by C-G formula based on SCr of 0.77 mg/dL).   Medical History: History reviewed. No pertinent past medical history.  Medications:  No medications prior to admission.    Assessment: 87 YOF with acute subsegmental pulmonary emboli to start IV heparin. H/H and Plt wnl. SCr wnl  01/16/2021 HL 0.84 supratherapeutic on 1550 units/hr Hgb 11.4, Plts WNL Per RN no bleeding  Goal of Therapy:  Heparin level 0.3-0.7 units/ml Monitor platelets by anticoagulation protocol: Yes   Plan:  -decrease heparin drip to 1350 units/hr -F/u 6 hr HL -Monitor daily HL, CBC and s/s of bleeding   01/18/2021 RPh 01/16/2021, 5:00 AM

## 2021-01-16 NOTE — Progress Notes (Addendum)
ANTICOAGULATION CONSULT NOTE - follow up  Pharmacy Consult for heparin Indication: pulmonary embolus  No Known Allergies  Patient Measurements: Height: 5\' 5"  (165.1 cm) Weight: 93 kg (205 lb) IBW/kg (Calculated) : 57 Heparin Dosing Weight: 93 kg   Vital Signs:    Labs: Recent Labs    01/15/21 1913 01/15/21 2046 01/16/21 0347 01/16/21 1135 01/16/21 1744  HGB 12.2  --  11.4*  --   --   HCT 38.2  --  35.9*  --   --   PLT 277  --  266  --   --   HEPARINUNFRC  --   --  0.84* 0.41 0.26*  CREATININE 0.77  --   --   --   --   TROPONINIHS <2 <2  --   --   --     Estimated Creatinine Clearance: 102.2 mL/min (by C-G formula based on SCr of 0.77 mg/dL).  Medications:  Medications Prior to Admission  Medication Sig Dispense Refill Last Dose  . naproxen sodium (ALEVE) 220 MG tablet Take 440 mg by mouth daily as needed (pain).   01/15/2021 at Unknown time  . Saccharomyces boulardii (PROBIOTIC) 250 MG CAPS Take 250 mg by mouth daily.   01/15/2021 at Unknown time    Assessment: 38 YOF with acute subsegmental pulmonary emboli to start IV heparin per Pharmacy dosing. No prior anticoagulation, although strong family Hx of blood clots.  01/16/2021  HL now therapeutic after reducing rate to 1350 units/hr  Hgb slightly low; Plt stable WNL  Per RN no bleeding or infusion issues  Goal of Therapy:  Heparin level 0.3-0.7 units/ml Monitor platelets by anticoagulation protocol: Yes   Plan:   Continue heparin drip at 1350 units/hr  Check confirmatory heparin level in 6 hr  Monitor daily HL, CBC and s/s of bleeding   F/u plans for long term anticoag (ie DOAC)  01/18/2021, PharmD, BCPS [phone] 01/16/2021, 12:29 PM   ADDENDUM:  Confirmatory heparin level now slightly subtherapeutic on 1350 units/hr  No infusion or bleeding issues per RN   Increase heparin rate to 1450 units/hr  Recheck 8 hr heparin level - suspect pt may need longer to reach steady state given  BMI  01/18/2021, PharmD, BCPS [phone] 01/16/2021, 7:26 PM

## 2021-01-16 NOTE — Progress Notes (Signed)
Pt with complaints of shortness of breath more than usual. Pt requested to stand for ease of breathing.  Assisted to standing position with 2 assist.  Pt placed on 2lnc with humidification.  Prior to O2 application, O2 sats were 94-95%.  Currently with O2, O2 sats 94-95%.  Elevated respirations d/t pt shallow breathing. Pt has toradol for pain, seems to not be effective.  LIP to be notified for other alternatives.  Meanwhile pt returned back to bed and HOB elevated to 90 degrees and use of 4 pillows for ease of breathing.  Warm back put to pt right back. Will continue to monitor and note changes.

## 2021-01-16 NOTE — Plan of Care (Signed)
  Problem: Education: Goal: Knowledge of General Education information will improve Description: Including pain rating scale, medication(s)/side effects and non-pharmacologic comfort measures 01/16/2021 2323 by Llana Aliment D, RN Outcome: Progressing 01/16/2021 2323 by Annitta Jersey, RN Outcome: Progressing   Problem: Health Behavior/Discharge Planning: Goal: Ability to manage health-related needs will improve 01/16/2021 2323 by Annitta Jersey, RN Outcome: Progressing 01/16/2021 2323 by Llana Aliment D, RN Outcome: Progressing   Problem: Clinical Measurements: Goal: Ability to maintain clinical measurements within normal limits will improve 01/16/2021 2323 by Llana Aliment D, RN Outcome: Progressing 01/16/2021 2323 by Llana Aliment D, RN Outcome: Progressing Goal: Diagnostic test results will improve 01/16/2021 2323 by Llana Aliment D, RN Outcome: Progressing 01/16/2021 2323 by Llana Aliment D, RN Outcome: Progressing Goal: Respiratory complications will improve 01/16/2021 2323 by Llana Aliment D, RN Outcome: Progressing 01/16/2021 2323 by Llana Aliment D, RN Outcome: Progressing   Problem: Activity: Goal: Risk for activity intolerance will decrease 01/16/2021 2323 by Llana Aliment D, RN Outcome: Progressing 01/16/2021 2323 by Llana Aliment D, RN Outcome: Progressing   Problem: Pain Managment: Goal: General experience of comfort will improve 01/16/2021 2323 by Annitta Jersey, RN Outcome: Progressing 01/16/2021 2323 by Llana Aliment D, RN Outcome: Progressing   Problem: Safety: Goal: Ability to remain free from injury will improve 01/16/2021 2323 by Annitta Jersey, RN Outcome: Progressing 01/16/2021 2323 by Llana Aliment D, RN Outcome: Progressing   Problem: Skin Integrity: Goal: Risk for impaired skin integrity will decrease 01/16/2021 2323 by Llana Aliment D, RN Outcome: Progressing 01/16/2021 2323 by Annitta Jersey, RN Outcome: Progressing

## 2021-01-17 DIAGNOSIS — I2699 Other pulmonary embolism without acute cor pulmonale: Secondary | ICD-10-CM

## 2021-01-17 DIAGNOSIS — Z6834 Body mass index (BMI) 34.0-34.9, adult: Secondary | ICD-10-CM | POA: Diagnosis not present

## 2021-01-17 DIAGNOSIS — Z20822 Contact with and (suspected) exposure to covid-19: Secondary | ICD-10-CM | POA: Diagnosis present

## 2021-01-17 DIAGNOSIS — Z832 Family history of diseases of the blood and blood-forming organs and certain disorders involving the immune mechanism: Secondary | ICD-10-CM | POA: Diagnosis not present

## 2021-01-17 DIAGNOSIS — E669 Obesity, unspecified: Secondary | ICD-10-CM | POA: Diagnosis present

## 2021-01-17 DIAGNOSIS — R0602 Shortness of breath: Secondary | ICD-10-CM | POA: Diagnosis present

## 2021-01-17 DIAGNOSIS — D72829 Elevated white blood cell count, unspecified: Secondary | ICD-10-CM | POA: Diagnosis present

## 2021-01-17 DIAGNOSIS — I2694 Multiple subsegmental pulmonary emboli without acute cor pulmonale: Secondary | ICD-10-CM | POA: Diagnosis present

## 2021-01-17 LAB — CBC
HCT: 33.1 % — ABNORMAL LOW (ref 36.0–46.0)
Hemoglobin: 10.4 g/dL — ABNORMAL LOW (ref 12.0–15.0)
MCH: 28.1 pg (ref 26.0–34.0)
MCHC: 31.4 g/dL (ref 30.0–36.0)
MCV: 89.5 fL (ref 80.0–100.0)
Platelets: 261 K/uL (ref 150–400)
RBC: 3.7 MIL/uL — ABNORMAL LOW (ref 3.87–5.11)
RDW: 14.8 % (ref 11.5–15.5)
WBC: 10.7 K/uL — ABNORMAL HIGH (ref 4.0–10.5)
nRBC: 0 % (ref 0.0–0.2)

## 2021-01-17 LAB — BETA-2-GLYCOPROTEIN I ABS, IGG/M/A
Beta-2 Glyco I IgG: <9 GPI IgG units (ref 0–20)
Beta-2-Glycoprotein I IgA: <9 GPI IgA units (ref 0–25)
Beta-2-Glycoprotein I IgM: 9 GPI IgM units (ref 0–32)

## 2021-01-17 LAB — HEPARIN LEVEL (UNFRACTIONATED)
Heparin Unfractionated: 0.44 IU/mL (ref 0.30–0.70)
Heparin Unfractionated: 0.29 [IU]/mL — ABNORMAL LOW (ref 0.30–0.70)

## 2021-01-17 MED ORDER — KETOROLAC TROMETHAMINE 15 MG/ML IJ SOLN
INTRAMUSCULAR | Status: AC
  Administered 2021-01-17: 15 mg via INTRAVENOUS
  Filled 2021-01-17: qty 1

## 2021-01-17 MED ORDER — HEPARIN BOLUS VIA INFUSION
4000.0000 [IU] | Freq: Once | INTRAVENOUS | Status: AC
  Administered 2021-01-17: 4000 [IU] via INTRAVENOUS
  Filled 2021-01-17: qty 4000

## 2021-01-17 MED ORDER — APIXABAN 5 MG PO TABS: 5.0000 mg | ORAL_TABLET | Freq: Two times a day (BID) | ORAL | Status: DC

## 2021-01-17 MED ORDER — APIXABAN 5 MG PO TABS
10.0000 mg | ORAL_TABLET | Freq: Two times a day (BID) | ORAL | Status: DC
  Administered 2021-01-17 – 2021-01-18 (×2): 10 mg via ORAL
  Filled 2021-01-17: qty 4
  Filled 2021-01-17: qty 2

## 2021-01-17 MED ORDER — KETOROLAC TROMETHAMINE 15 MG/ML IJ SOLN: 15.0000 mg | Freq: Four times a day (QID) | INTRAMUSCULAR | Status: DC | PRN

## 2021-01-17 NOTE — Progress Notes (Signed)
ANTICOAGULATION CONSULT NOTE - follow up  Pharmacy Consult for heparin Indication: pulmonary embolus  No Known Allergies  Patient Measurements: Height: 5\' 5"  (165.1 cm) Weight: 93 kg (205 lb) IBW/kg (Calculated) : 57 Heparin Dosing Weight: 93 kg   Vital Signs: Temp: 98.4 F (36.9 C) (05/29 0519) Temp Source: Oral (05/29 0519) BP: 125/80 (05/29 0519) Pulse Rate: 71 (05/29 0519)  Labs: Recent Labs    01/15/21 1913 01/15/21 2046 01/16/21 0347 01/16/21 1135 01/16/21 1744 01/17/21 0347 01/17/21 0937  HGB 12.2  --  11.4*  --   --  10.4*  --   HCT 38.2  --  35.9*  --   --  33.1*  --   PLT 277  --  266  --   --  261  --   HEPARINUNFRC  --   --  0.84*   < > 0.26* 0.44 0.29*  CREATININE 0.77  --   --   --   --   --   --   TROPONINIHS <2 <2  --   --   --   --   --    < > = values in this interval not displayed.    Estimated Creatinine Clearance: 102.2 mL/min (by C-G formula based on SCr of 0.77 mg/dL).  Medications:  Medications Prior to Admission  Medication Sig Dispense Refill Last Dose  . naproxen sodium (ALEVE) 220 MG tablet Take 440 mg by mouth daily as needed (pain).   01/15/2021 at Unknown time  . Saccharomyces boulardii (PROBIOTIC) 250 MG CAPS Take 250 mg by mouth daily.   01/15/2021 at Unknown time    Assessment: 62 YOF with acute subsegmental pulmonary emboli to start IV heparin per Pharmacy dosing. No prior anticoagulation, although strong family Hx of blood clots.  01/17/2021  HL again subtherapeutic after previously being at goal - suspect body habitus may be leading to longer than normal distribution times for heparin boluses, making post-bolus levels appear higher than actual.  Hgb down to 10.4 Plt stable WNL  Per RN no bleeding or infusion issues  Goal of Therapy:  Heparin level 0.3-0.7 units/ml Monitor platelets by anticoagulation protocol: Yes   Plan:   Increase heparin infusion to 1550 units/hr - may require more than this, but since initially  supratherapeutic on this rate will not advance beyond this until levels indicate otherwise  Check confirmatory heparin level in 8 hr  Monitor daily HL, CBC and s/s of bleeding   F/u plans for long term anticoag (ie DOAC)  01/19/2021, PharmD, BCPS [phone] 01/17/2021, 12:30 PM

## 2021-01-17 NOTE — Discharge Instructions (Signed)
Information on my medicine - ELIQUIS (apixaban)  This medication education was reviewed with me or my healthcare representative as part of my discharge preparation.  The pharmacist that spoke with me during my hospital stay was:  Christmas Faraci A, RPH  Why was Eliquis prescribed for you? Eliquis was prescribed to treat blood clots that may have been found in the veins of your legs (deep vein thrombosis) or in your lungs (pulmonary embolism) and to reduce the risk of them occurring again.  What do You need to know about Eliquis ? The starting dose is 10 mg (two 5 mg tablets) taken TWICE daily for the FIRST SEVEN (7) DAYS, then on (enter date)  01/24/21  the dose is reduced to ONE 5 mg tablet taken TWICE daily.  Eliquis may be taken with or without food.   Try to take the dose about the same time in the morning and in the evening. If you have difficulty swallowing the tablet whole please discuss with your pharmacist how to take the medication safely.  Take Eliquis exactly as prescribed and DO NOT stop taking Eliquis without talking to the doctor who prescribed the medication.  Stopping may increase your risk of developing a new blood clot.  Refill your prescription before you run out.  After discharge, you should have regular check-up appointments with your healthcare provider that is prescribing your Eliquis.    What do you do if you miss a dose? If a dose of ELIQUIS is not taken at the scheduled time, take it as soon as possible on the same day and twice-daily administration should be resumed. The dose should not be doubled to make up for a missed dose.  Important Safety Information A possible side effect of Eliquis is bleeding. You should call your healthcare provider right away if you experience any of the following: ? Bleeding from an injury or your nose that does not stop. ? Unusual colored urine (red or dark brown) or unusual colored stools (red or black). ? Unusual bruising for  unknown reasons. ? A serious fall or if you hit your head (even if there is no bleeding).  Some medicines may interact with Eliquis and might increase your risk of bleeding or clotting while on Eliquis. To help avoid this, consult your healthcare provider or pharmacist prior to using any new prescription or non-prescription medications, including herbals, vitamins, non-steroidal anti-inflammatory drugs (NSAIDs) and supplements.  This website has more information on Eliquis (apixaban): http://www.eliquis.com/eliquis/home

## 2021-01-17 NOTE — Plan of Care (Signed)
  Problem: Education: Goal: Knowledge of General Education information will improve Description: Including pain rating scale, medication(s)/side effects and non-pharmacologic comfort measures Outcome: Progressing   Problem: Health Behavior/Discharge Planning: Goal: Ability to manage health-related needs will improve Outcome: Progressing   Problem: Clinical Measurements: Goal: Ability to maintain clinical measurements within normal limits will improve Outcome: Progressing Goal: Diagnostic test results will improve Outcome: Progressing Goal: Respiratory complications will improve Outcome: Progressing   Problem: Activity: Goal: Risk for activity intolerance will decrease Outcome: Progressing   Problem: Pain Managment: Goal: General experience of comfort will improve Outcome: Progressing   Problem: Safety: Goal: Ability to remain free from injury will improve Outcome: Progressing   Problem: Skin Integrity: Goal: Risk for impaired skin integrity will decrease Outcome: Progressing

## 2021-01-17 NOTE — Progress Notes (Signed)
PROGRESS NOTE    Carly Garrison  ZOX:[id number] DOB: 07-29-1976 DOA: 01/15/2021 PCP: Patient, No Pcp Per (Inactive)   Brief Narrative:  This 45 years old female with no significant PMH other than obesity presents in the ED with c/o: acute onset of right-sided pleuritic chest pain associated with shortness of breath.  In the ED D-dimer was slightly elevated,  CT angiogram chest reveals acute segmental pulmonary emboli within the right upper and lower lobes.  No evidence of right heart strain. Patient was started on IV heparin. Patient reports significant family history of blood clots in the sister and mother. Will consider transitioning heparin to Eliquis in 24 to 48 hours.  Assessment & Plan:   Principal Problem:   Acute pulmonary embolism (HCC) Active Problems:   Leukocytosis   Obesity  Acute segmental pulmonary embolism: Patient presented with acute onset of right-sided pleuritic chest pain associated with shortness of breath. D-dimer was elevated.  CT angiogram chest showing acute segmental pulmonary embolism within the right upper and lower lobes. No evidence of right heart strain noted. Patient is hemodynamically stable. Continue IV heparin with the plan to transition to DOAC prior to discharge. Continue tramadol as needed for pain Continue pulse oximetry. Venous duplex negative for DVT. Follow-up hypercoagulable work-up.    DVT prophylaxis: Heparin Code Status: Full code. Family Communication: Mother at bedside. Disposition Plan:   Status is: Inpatient  Remains inpatient appropriate because:Inpatient level of care appropriate due to severity of illness   Dispo: The patient is from: Home              Anticipated d/c is to: Home              Patient currently is not medically stable to d/c.   Difficult to place patient No  Consultants:   None  Procedures:  Antimicrobials:  Anti-infectives (From admission, onward)   None     Subjective: Patient was seen and  examined at bedside.  Overnight events noted.   She was short of breath yesterday but O2 saturation remained stable.   She is lying comfortably in the morning.  Objective: Vitals:   01/16/21 0218 01/16/21 0628 01/16/21 2111 01/17/21 0519  BP: 135/89 126/85 112/79 125/80  Pulse: 83 86 85 71  Resp: (!) 22 20 (!) 24 20  Temp: 98.7 F (37.1 C) 98.7 F (37.1 C) 98.7 F (37.1 C) 98.4 F (36.9 C)  TempSrc: Oral Oral Oral Oral  SpO2: 95% 96% 98% 96%  Weight:      Height:        Intake/Output Summary (Last 24 hours) at 01/17/2021 1409 Last data filed at 01/17/2021 1000 Gross per 24 hour  Intake 963.25 ml  Output --  Net 963.25 ml   Filed Weights   01/15/21 1321  Weight: 93 kg    Examination:  General exam: Appears calm and comfortable, not in any acute distress. Respiratory system: Clear to auscultation. Respiratory effort normal. Cardiovascular system: S1 & S2 heard, RRR. No JVD, murmurs, rubs, gallops or clicks. No pedal edema. Gastrointestinal system: Abdomen is nondistended, soft and nontender. No organomegaly or masses felt.  Normal bowel sounds heard. Central nervous system: Alert and oriented. No focal neurological deficits. Extremities: Symmetric 5 x 5 power.  No edema, no cyanosis, no clubbing. Skin: No rashes, lesions or ulcers Psychiatry: Judgement and insight appear normal. Mood & affect appropriate.     Data Reviewed: I have personally reviewed following labs and imaging studies  CBC: Recent Labs  Lab 01/15/21 1913 01/16/21 0347 01/17/21 0347  WBC 11.3* 11.2* 10.7*  NEUTROABS 9.6*  --   --   HGB 12.2 11.4* 10.4*  HCT 38.2 35.9* 33.1*  MCV 88.4 88.6 89.5  PLT 277 266 261   Basic Metabolic Panel: Recent Labs  Lab 01/15/21 1913  NA 134*  K 4.0  CL 103  CO2 22  GLUCOSE 103*  BUN 11  CREATININE 0.77  CALCIUM 9.2   GFR: Estimated Creatinine Clearance: 102.2 mL/min (by C-G formula based on SCr of 0.77 mg/dL). Liver Function Tests: Recent Labs   Lab 01/15/21 1913  AST 10*  ALT 10  ALKPHOS 62  BILITOT 0.4  PROT 8.5*  ALBUMIN 4.2   Recent Labs  Lab 01/15/21 1913  LIPASE 16   No results for input(s): AMMONIA in the last 168 hours. Coagulation Profile: No results for input(s): INR, PROTIME in the last 168 hours. Cardiac Enzymes: No results for input(s): CKTOTAL, CKMB, CKMBINDEX, TROPONINI in the last 168 hours. BNP (last 3 results) No results for input(s): PROBNP in the last 8760 hours. HbA1C: No results for input(s): HGBA1C in the last 72 hours. CBG: No results for input(s): GLUCAP in the last 168 hours. Lipid Profile: No results for input(s): CHOL, HDL, LDLCALC, TRIG, CHOLHDL, LDLDIRECT in the last 72 hours. Thyroid Function Tests: No results for input(s): TSH, T4TOTAL, FREET4, T3FREE, THYROIDAB in the last 72 hours. Anemia Panel: No results for input(s): VITAMINB12, FOLATE, FERRITIN, TIBC, IRON, RETICCTPCT in the last 72 hours. Sepsis Labs: No results for input(s): PROCALCITON, LATICACIDVEN in the last 168 hours.  Recent Results (from the past 240 hour(s))  Resp Panel by RT-PCR (Flu A&B, Covid) Nasopharyngeal Swab     Status: None   Collection Time: 01/15/21  9:01 PM   Specimen: Nasopharyngeal Swab; Nasopharyngeal(NP) swabs in vial transport medium  Result Value Ref Range Status   SARS Coronavirus 2 by RT PCR NEGATIVE NEGATIVE Final    Comment: (NOTE) SARS-CoV-2 target nucleic acids are NOT DETECTED.  The SARS-CoV-2 RNA is generally detectable in upper respiratory specimens during the acute phase of infection. The lowest concentration of SARS-CoV-2 viral copies this assay can detect is 138 copies/mL. A negative result does not preclude SARS-Cov-2 infection and should not be used as the sole basis for treatment or other patient management decisions. A negative result may occur with  improper specimen collection/handling, submission of specimen other than nasopharyngeal swab, presence of viral mutation(s)  within the areas targeted by this assay, and inadequate number of viral copies(<138 copies/mL). A negative result must be combined with clinical observations, patient history, and epidemiological information. The expected result is Negative.  Fact Sheet for Patients:  BloggerCourse.com  Fact Sheet for Healthcare Providers:  SeriousBroker.it  This test is no t yet approved or cleared by the [place] FDA and  has been authorized for detection and/or diagnosis of SARS-CoV-2 by FDA under an Emergency Use Authorization (EUA). This EUA will remain  in effect (meaning this test can be used) for the duration of the COVID-19 declaration under Section 564(b)(1) of the Act, 21 U.S.C.section 360bbb-3(b)(1), unless the authorization is terminated  or revoked sooner.       Influenza A by PCR NEGATIVE NEGATIVE Final   Influenza B by PCR NEGATIVE NEGATIVE Final    Comment: (NOTE) The Xpert Xpress SARS-CoV-2/FLU/RSV plus assay is intended as an aid in the diagnosis of influenza from Nasopharyngeal swab specimens and should not be used as a sole basis for treatment. Nasal  washings and aspirates are unacceptable for Xpert Xpress SARS-CoV-2/FLU/RSV testing.  Fact Sheet for Patients: BloggerCourse.com  Fact Sheet for Healthcare Providers: SeriousBroker.it  This test is not yet approved or cleared by the [place] FDA and has been authorized for detection and/or diagnosis of SARS-CoV-2 by FDA under an Emergency Use Authorization (EUA). This EUA will remain in effect (meaning this test can be used) for the duration of the COVID-19 declaration under Section 564(b)(1) of the Act, 21 U.S.C. section 360bbb-3(b)(1), unless the authorization is terminated or revoked.  Performed at [facility], [address] [place], [place] [address]    Radiology Studies: CT Angio  Chest PE W/Cm &/Or Wo Cm  Result Date: 01/15/2021 CLINICAL DATA:  Right rib pain and short of breath EXAM: CT ANGIOGRAPHY CHEST WITH CONTRAST TECHNIQUE: Multidetector CT imaging of the chest was performed using the standard protocol during bolus administration of intravenous contrast. Multiplanar CT image reconstructions and MIPs were obtained to evaluate the vascular anatomy. CONTRAST:  17mL OMNIPAQUE IOHEXOL 350 MG/ML SOLN COMPARISON:  Chest x-ray 01/15/2021 FINDINGS: Cardiovascular: Satisfactory opacification of the pulmonary arteries to the segmental level. Small subsegmental emboli within the right upper lobe, series 5, image 73. Several subsegmental emboli also evident within subsegmental right lower lobe pulmonary vessels, series 5, image 152. Aorta is nonaneurysmal. Cardiac size within normal limits. No pericardial effusion Mediastinum/Nodes: No enlarged mediastinal, hilar, or axillary lymph nodes. Thyroid gland, trachea, and esophagus demonstrate no significant findings. Lungs/Pleura: No pleural effusion or pneumothorax. Hazy right greater than left pulmonary densities at the bases, favor atelectasis, less likely infarct on the right side. Upper Abdomen: No acute abnormality. Musculoskeletal: No chest wall abnormality. No acute or significant osseous findings. Review of the MIP images confirms the above findings. IMPRESSION: 1. Positive for acute subsegmental emboli within the right upper and lower lobes. 2. Hazy right greater than left lower lobe airspace disease, favored to represent atelectasis, though small infarcts on the right could also be considered. Critical Value/emergent results were called by telephone at the time of interpretation on 01/15/2021 at 8:37 pm to provider Vanetta Mulders , who verbally acknowledged these results. Electronically Signed   By: Jasmine Pang M.D.   On: 01/15/2021 20:37   VAS [place] LOWER EXTREMITY VENOUS (DVT)  Result Date: 01/16/2021  Lower Venous DVT Study Patient  Name:  AMEIRAH KHATOON  Date of Exam:   01/16/2021 Medical Rec #: [id number]      Accession #:    [id number] Date of Birth: 11-08-1975      Patient Gender: F Patient Age:   44Y Exam Location:  [facility] Procedure:      VAS [place] LOWER EXTREMITY VENOUS (DVT) Referring Phys: [id number] VASUNDHRA RATHORE --------------------------------------------------------------------------------  Indications: Pulmonary embolism.  Risk Factors: Confirmed PE. Limitations: Poor ultrasound/tissue interface. Comparison Study: No prior studies. Performing Technologist: Chanda Busing RVT  Examination Guidelines: A complete evaluation includes B-mode imaging, spectral Doppler, color Doppler, and power Doppler as needed of all accessible portions of each vessel. Bilateral testing is considered an integral part of a complete examination. Limited examinations for reoccurring indications may be performed as noted. The reflux portion of the exam is performed with the patient in reverse Trendelenburg.  +---------+---------------+---------+-----------+----------+--------------+ RIGHT    CompressibilityPhasicitySpontaneityPropertiesThrombus Aging +---------+---------------+---------+-----------+----------+--------------+ CFV      Full           Yes      Yes                                 +---------+---------------+---------+-----------+----------+--------------+  SFJ      Full                                                        +---------+---------------+---------+-----------+----------+--------------+ FV Prox  Full                                                        +---------+---------------+---------+-----------+----------+--------------+ FV Mid   Full                                                        +---------+---------------+---------+-----------+----------+--------------+ FV DistalFull                                                         +---------+---------------+---------+-----------+----------+--------------+ PFV      Full                                                        +---------+---------------+---------+-----------+----------+--------------+ POP      Full           Yes      Yes                                 +---------+---------------+---------+-----------+----------+--------------+ PTV      Full                                                        +---------+---------------+---------+-----------+----------+--------------+ PERO     Full                                                        +---------+---------------+---------+-----------+----------+--------------+   +---------+---------------+---------+-----------+----------+--------------+ LEFT     CompressibilityPhasicitySpontaneityPropertiesThrombus Aging +---------+---------------+---------+-----------+----------+--------------+ CFV      Full           Yes      Yes                                 +---------+---------------+---------+-----------+----------+--------------+ SFJ      Full                                                        +---------+---------------+---------+-----------+----------+--------------+  FV Prox  Full                                                        +---------+---------------+---------+-----------+----------+--------------+ FV Mid   Full                                                        +---------+---------------+---------+-----------+----------+--------------+ FV DistalFull                                                        +---------+---------------+---------+-----------+----------+--------------+ PFV      Full                                                        +---------+---------------+---------+-----------+----------+--------------+ POP      Full           Yes      Yes                                  +---------+---------------+---------+-----------+----------+--------------+ PTV      Full                                                        +---------+---------------+---------+-----------+----------+--------------+ PERO     Full                                                        +---------+---------------+---------+-----------+----------+--------------+     Summary: RIGHT: - There is no evidence of deep vein thrombosis in the lower extremity.  - No cystic structure found in the popliteal fossa.  LEFT: - There is no evidence of deep vein thrombosis in the lower extremity.  - No cystic structure found in the popliteal fossa.  *See table(s) above for measurements and observations. Electronically signed by Sherald Hess MD on 01/16/2021 at 11:02:11 AM.    Final    Scheduled Meds: Continuous Infusions: . heparin 1,550 Units/hr (01/17/21 1324)     LOS: 0 days    Time spent: 25 mins   Kallista Pae, MD [facility]   If 7PM-7AM, please contact night-coverage

## 2021-01-17 NOTE — Progress Notes (Signed)
ANTICOAGULATION CONSULT NOTE - follow up  Pharmacy Consult for heparin Indication: pulmonary embolus  No Known Allergies  Patient Measurements: Height: 5\' 5"  (165.1 cm) Weight: 93 kg (205 lb) IBW/kg (Calculated) : 57 Heparin Dosing Weight: 93 kg   Vital Signs: Temp: 98.7 F (37.1 C) (05/28 2111) Temp Source: Oral (05/28 2111) BP: 112/79 (05/28 2111) Pulse Rate: 85 (05/28 2111)  Labs: Recent Labs    01/15/21 1913 01/15/21 1913 01/15/21 2046 01/16/21 0347 01/16/21 1135 01/16/21 1744 01/17/21 0347  HGB 12.2  --   --  11.4*  --   --  10.4*  HCT 38.2  --   --  35.9*  --   --  33.1*  PLT 277  --   --  266  --   --  261  HEPARINUNFRC  --    < >  --  0.84* 0.41 0.26* 0.44  CREATININE 0.77  --   --   --   --   --   --   TROPONINIHS <2  --  <2  --   --   --   --    < > = values in this interval not displayed.    Estimated Creatinine Clearance: 102.2 mL/min (by C-G formula based on SCr of 0.77 mg/dL).  Medications:  Medications Prior to Admission  Medication Sig Dispense Refill Last Dose  . naproxen sodium (ALEVE) 220 MG tablet Take 440 mg by mouth daily as needed (pain).   01/15/2021 at Unknown time  . Saccharomyces boulardii (PROBIOTIC) 250 MG CAPS Take 250 mg by mouth daily.   01/15/2021 at Unknown time    Assessment: 50 YOF with acute subsegmental pulmonary emboli to start IV heparin per Pharmacy dosing. No prior anticoagulation, although strong family Hx of blood clots.  01/17/2021  HL 0.44 therapeutic on 1450 units/hr  Hgb down to 10.4 Plt stable WNL  Per RN no bleeding or infusion issues  Goal of Therapy:  Heparin level 0.3-0.7 units/ml Monitor platelets by anticoagulation protocol: Yes   Plan:   Continue heparin drip at 1450 units/hr  Check confirmatory heparin level in 6 hr  Monitor daily HL, CBC and s/s of bleeding   F/u plans for long term anticoag (ie DOAC)  01/19/2021 RPh 01/17/2021, 5:05 AM

## 2021-01-18 LAB — CBC
HCT: 32.7 % — ABNORMAL LOW (ref 36.0–46.0)
Hemoglobin: 10.5 g/dL — ABNORMAL LOW (ref 12.0–15.0)
MCH: 28.5 pg (ref 26.0–34.0)
MCHC: 32.1 g/dL (ref 30.0–36.0)
MCV: 88.9 fL (ref 80.0–100.0)
Platelets: 254 10*3/uL (ref 150–400)
RBC: 3.68 MIL/uL — ABNORMAL LOW (ref 3.87–5.11)
RDW: 14.7 % (ref 11.5–15.5)
WBC: 5.8 10*3/uL (ref 4.0–10.5)
nRBC: 0 % (ref 0.0–0.2)

## 2021-01-18 MED ORDER — APIXABAN 5 MG PO TABS: 5.0000 mg | ORAL_TABLET | Freq: Two times a day (BID) | ORAL | 5 refills | Status: DC

## 2021-01-18 MED ORDER — APIXABAN 5 MG PO TABS: 10.0000 mg | ORAL_TABLET | Freq: Two times a day (BID) | ORAL | 0 refills | Status: DC

## 2021-01-18 NOTE — Discharge Summary (Addendum)
Physician Discharge Summary  Carly Garrison UJW:[id number] DOB: [birth year] DOA: 01/15/2021  PCP: Patient, No Pcp Per (Inactive)  Admit date: 01/15/2021   Discharge date: 01/18/2021  Admitted From: Home.  Disposition: Home  Recommendations for Outpatient Follow-up:  1. Follow up with PCP in 1-2 weeks 2. Please obtain BMP/CBC in one week 3. Take Eliquis 10 mg twice daily for 1 week followed by 5 mg twice daily for 6 to 9 months. 4. We will request PCP to follow-up on hypercoagulable work-up.  Home Health: None Equipment/Devices: None Discharge Condition: Stable CODE STATUS:Full code Diet recommendation: Heart Healthy   Brief Summary / Hospital Course: This 45 years old female with no significant PMH other than obesity presents in the ED withc/o:acute onset of right-sided pleuritic chest pain associated with shortness of breath. In the ED D-dimer was slightly elevated,CT angiogram chest revealed acute segmental pulmonary emboli within the right upper and lower lobes. No evidence of right heart strain. Patient was admitted for acute pulmonary embolism and was started on IV heparin. Patient reports significant family history of blood clots in the sister and mother.  She remained on IV heparin for 48 hours, she was not hypoxic, ambulated well without any difficulty breathing.  Venous duplex was negative for DVT. Patient was successfully transitioned on Eliquis.  Patient feels better and wants to be discharged.  Patient will take Eliquis 10 mg twice daily for 1 week followed by 5 mg twice daily for 6 to 9 months.  Patient will follow up with primary care physician.We will request her PCP to check on hypercoagulable work-up.  She was managed for below problems.  Discharge Diagnoses:  Principal Problem:   Acute pulmonary embolism (HCC) Active Problems:   Leukocytosis   Obesity   Acute segmental pulmonary embolism: Patient presented with acute onset of right-sided pleuritic chest pain  associated with shortness of breath. D-dimer was elevated.  CT angiogram chest showing acute segmental pulmonary embolism within the right upper and lower lobes. No evidence of right heart strain noted. Patient is hemodynamically stable. Continue IV heparin with the plan to transition to DOAC prior to discharge. Continue tramadol as needed for pain Continue pulse oximetry. Venous duplex negative for DVT. She was successfully transitioned on Eliquis. Patient feels better and wants to be discharged.   Discharge Instructions  Discharge Instructions    Call MD for:  difficulty breathing, headache or visual disturbances   Complete by: As directed    Call MD for:  persistant dizziness or light-headedness   Complete by: As directed    Call MD for:  persistant nausea and vomiting   Complete by: As directed    Diet - low sodium heart healthy   Complete by: As directed    Diet Carb Modified   Complete by: As directed    Discharge instructions   Complete by: As directed    Advised to follow-up with primary care physician in 1 week. Advised to take Eliquis 10 mg twice daily for 1 week followed by 5 mg twice daily for next 6 to 9 months.   Increase activity slowly   Complete by: As directed      Allergies as of 01/18/2021   No Known Allergies     Medication List    STOP taking these medications   naproxen sodium 220 MG tablet Commonly known as: ALEVE     TAKE these medications   apixaban 5 MG Tabs tablet Commonly known as: ELIQUIS Take 2 tablets (10 mg total) by  mouth 2 (two) times daily.   apixaban 5 MG Tabs tablet Commonly known as: ELIQUIS Take 1 tablet (5 mg total) by mouth 2 (two) times daily. Start taking on: January 24, 2021   Probiotic 250 MG Caps Take 250 mg by mouth daily.       Follow-up Information    Pahwani, Rinka R, MD Follow up in 1 week(s).   Specialty: Internal Medicine Contact information: [address] [place]  [address] [phone]              No Known Allergies  Consultations:  None   Procedures/Studies: DG Chest 2 View  Result Date: 01/15/2021 CLINICAL DATA:  Shortness of breath, RIGHT rib pain, worsening symptoms EXAM: CHEST - 2 VIEW COMPARISON:  11/10/2020 FINDINGS: Normal heart size, mediastinal contours, and pulmonary vascularity. Lungs clear. No pleural effusion or pneumothorax. Bones unremarkable. IMPRESSION: Normal exam. Electronically Signed   By: Ulyses Southward M.D.   On: 01/15/2021 14:09   CT Angio Chest PE W/Cm &/Or Wo Cm  Result Date: 01/15/2021 CLINICAL DATA:  Right rib pain and short of breath EXAM: CT ANGIOGRAPHY CHEST WITH CONTRAST TECHNIQUE: Multidetector CT imaging of the chest was performed using the standard protocol during bolus administration of intravenous contrast. Multiplanar CT image reconstructions and MIPs were obtained to evaluate the vascular anatomy. CONTRAST:  41mL OMNIPAQUE IOHEXOL 350 MG/ML SOLN COMPARISON:  Chest x-ray 01/15/2021 FINDINGS: Cardiovascular: Satisfactory opacification of the pulmonary arteries to the segmental level. Small subsegmental emboli within the right upper lobe, series 5, image 73. Several subsegmental emboli also evident within subsegmental right lower lobe pulmonary vessels, series 5, image 152. Aorta is nonaneurysmal. Cardiac size within normal limits. No pericardial effusion Mediastinum/Nodes: No enlarged mediastinal, hilar, or axillary lymph nodes. Thyroid gland, trachea, and esophagus demonstrate no significant findings. Lungs/Pleura: No pleural effusion or pneumothorax. Hazy right greater than left pulmonary densities at the bases, favor atelectasis, less likely infarct on the right side. Upper Abdomen: No acute abnormality. Musculoskeletal: No chest wall abnormality. No acute or significant osseous findings. Review of the MIP images confirms the above findings. IMPRESSION: 1. Positive for acute subsegmental emboli within the right upper  and lower lobes. 2. Hazy right greater than left lower lobe airspace disease, favored to represent atelectasis, though small infarcts on the right could also be considered. Critical Value/emergent results were called by telephone at the time of interpretation on 01/15/2021 at 8:37 pm to provider Vanetta Mulders , who verbally acknowledged these results. Electronically Signed   By: Jasmine Pang M.D.   On: 01/15/2021 20:37   VAS [place] LOWER EXTREMITY VENOUS (DVT)  Result Date: 01/16/2021  Lower Venous DVT Study Patient Name:  GENEVIVE PRINTUP  Date of Exam:   01/16/2021 Medical Rec #: [id number]      Accession #:    [id number] Date of Birth: Feb 06, 1976      Patient Gender: F Patient Age:   44Y Exam Location:  [facility] Procedure:      VAS [place] LOWER EXTREMITY VENOUS (DVT) Referring Phys: [id number] VASUNDHRA RATHORE --------------------------------------------------------------------------------  Indications: Pulmonary embolism.  Risk Factors: Confirmed PE. Limitations: Poor ultrasound/tissue interface. Comparison Study: No prior studies. Performing Technologist: Chanda Busing RVT  Examination Guidelines: A complete evaluation includes B-mode imaging, spectral Doppler, color Doppler, and power Doppler as needed of all accessible portions of each vessel. Bilateral testing is considered an integral part of a complete examination. Limited examinations for reoccurring indications may be performed as noted. The reflux portion  of the exam is performed with the patient in reverse Trendelenburg.  +---------+---------------+---------+-----------+----------+--------------+ RIGHT    CompressibilityPhasicitySpontaneityPropertiesThrombus Aging +---------+---------------+---------+-----------+----------+--------------+ CFV      Full           Yes      Yes                                 +---------+---------------+---------+-----------+----------+--------------+ SFJ      Full                                                         +---------+---------------+---------+-----------+----------+--------------+ FV Prox  Full                                                        +---------+---------------+---------+-----------+----------+--------------+ FV Mid   Full                                                        +---------+---------------+---------+-----------+----------+--------------+ FV DistalFull                                                        +---------+---------------+---------+-----------+----------+--------------+ PFV      Full                                                        +---------+---------------+---------+-----------+----------+--------------+ POP      Full           Yes      Yes                                 +---------+---------------+---------+-----------+----------+--------------+ PTV      Full                                                        +---------+---------------+---------+-----------+----------+--------------+ PERO     Full                                                        +---------+---------------+---------+-----------+----------+--------------+   +---------+---------------+---------+-----------+----------+--------------+ LEFT     CompressibilityPhasicitySpontaneityPropertiesThrombus Aging +---------+---------------+---------+-----------+----------+--------------+ CFV      Full           Yes      Yes                                 +---------+---------------+---------+-----------+----------+--------------+  SFJ      Full                                                        +---------+---------------+---------+-----------+----------+--------------+ FV Prox  Full                                                        +---------+---------------+---------+-----------+----------+--------------+ FV Mid   Full                                                         +---------+---------------+---------+-----------+----------+--------------+ FV DistalFull                                                        +---------+---------------+---------+-----------+----------+--------------+ PFV      Full                                                        +---------+---------------+---------+-----------+----------+--------------+ POP      Full           Yes      Yes                                 +---------+---------------+---------+-----------+----------+--------------+ PTV      Full                                                        +---------+---------------+---------+-----------+----------+--------------+ PERO     Full                                                        +---------+---------------+---------+-----------+----------+--------------+     Summary: RIGHT: - There is no evidence of deep vein thrombosis in the lower extremity.  - No cystic structure found in the popliteal fossa.  LEFT: - There is no evidence of deep vein thrombosis in the lower extremity.  - No cystic structure found in the popliteal fossa.  *See table(s) above for measurements and observations. Electronically signed by Sherald Hesshristopher Clark MD on 01/16/2021 at 11:02:11 AM.    Final       Subjective: Patient was seen and examined at bedside.  Overnight events noted.   Patient reports feeling much better.  Patient has ambulated in the hallway without any difficulty  breathing.   Patient wants to be discharged.  Discharge Exam: Vitals:   01/17/21 2037 01/18/21 0417  BP: 115/73 124/81  Pulse: 81 72  Resp: 18 18  Temp: 98.4 F (36.9 C) 98.4 F (36.9 C)  SpO2: 100% 100%   Vitals:   01/16/21 2111 01/17/21 0519 01/17/21 2037 01/18/21 0417  BP: 112/79 125/80 115/73 124/81  Pulse: 85 71 81 72  Resp: (!) 24 20 18 18   Temp: 98.7 F (37.1 C) 98.4 F (36.9 C) 98.4 F (36.9 C) 98.4 F (36.9 C)  TempSrc: Oral Oral Oral   SpO2: 98% 96% 100% 100%   Weight:      Height:        General: Pt is alert, awake, not in acute distress Cardiovascular: RRR, S1/S2 +, no rubs, no gallops Respiratory: CTA bilaterally, no wheezing, no rhonchi Abdominal: Soft, NT, ND, bowel sounds + Extremities: no edema, no cyanosis    The results of significant diagnostics from this hospitalization (including imaging, microbiology, ancillary and laboratory) are listed below for reference.     Microbiology: Recent Results (from the past 240 hour(s))  Resp Panel by RT-PCR (Flu A&B, Covid) Nasopharyngeal Swab     Status: None   Collection Time: 01/15/21  9:01 PM   Specimen: Nasopharyngeal Swab; Nasopharyngeal(NP) swabs in vial transport medium  Result Value Ref Range Status   SARS Coronavirus 2 by RT PCR NEGATIVE NEGATIVE Final    Comment: (NOTE) SARS-CoV-2 target nucleic acids are NOT DETECTED.  The SARS-CoV-2 RNA is generally detectable in upper respiratory specimens during the acute phase of infection. The lowest concentration of SARS-CoV-2 viral copies this assay can detect is 138 copies/mL. A negative result does not preclude SARS-Cov-2 infection and should not be used as the sole basis for treatment or other patient management decisions. A negative result may occur with  improper specimen collection/handling, submission of specimen other than nasopharyngeal swab, presence of viral mutation(s) within the areas targeted by this assay, and inadequate number of viral copies(<138 copies/mL). A negative result must be combined with clinical observations, patient history, and epidemiological information. The expected result is Negative.  Fact Sheet for Patients:  01/17/21  Fact Sheet for Healthcare Providers:  BloggerCourse.com  This test is no t yet approved or cleared by the [place] FDA and  has been authorized for detection and/or diagnosis of SARS-CoV-2 by FDA under an Emergency  Use Authorization (EUA). This EUA will remain  in effect (meaning this test can be used) for the duration of the COVID-19 declaration under Section 564(b)(1) of the Act, 21 U.S.C.section 360bbb-3(b)(1), unless the authorization is terminated  or revoked sooner.       Influenza A by PCR NEGATIVE NEGATIVE Final   Influenza B by PCR NEGATIVE NEGATIVE Final    Comment: (NOTE) The Xpert Xpress SARS-CoV-2/FLU/RSV plus assay is intended as an aid in the diagnosis of influenza from Nasopharyngeal swab specimens and should not be used as a sole basis for treatment. Nasal washings and aspirates are unacceptable for Xpert Xpress SARS-CoV-2/FLU/RSV testing.  Fact Sheet for Patients: [place]  Fact Sheet for Healthcare Providers: BloggerCourse.com  This test is not yet approved or cleared by the [place] FDA and has been authorized for detection and/or diagnosis of SARS-CoV-2 by FDA under an Emergency Use Authorization (EUA). This EUA will remain in effect (meaning this test can be used) for the duration of the COVID-19 declaration under Section 564(b)(1) of the Act, 21 U.S.C. section 360bbb-3(b)(1), unless the authorization is  terminated or revoked.  Performed at [facility], [address], [place], [place] [address]      Labs: BNP (last 3 results) No results for input(s): BNP in the last 8760 hours. Basic Metabolic Panel: Recent Labs  Lab 01/15/21 1913  NA 134*  K 4.0  CL 103  CO2 22  GLUCOSE 103*  BUN 11  CREATININE 0.77  CALCIUM 9.2   Liver Function Tests: Recent Labs  Lab 01/15/21 1913  AST 10*  ALT 10  ALKPHOS 62  BILITOT 0.4  PROT 8.5*  ALBUMIN 4.2   Recent Labs  Lab 01/15/21 1913  LIPASE 16   No results for input(s): AMMONIA in the last 168 hours. CBC: Recent Labs  Lab 01/15/21 1913 01/16/21 0347 01/17/21 0347 01/18/21 0355  WBC 11.3* 11.2* 10.7* 5.8  NEUTROABS  9.6*  --   --   --   HGB 12.2 11.4* 10.4* 10.5*  HCT 38.2 35.9* 33.1* 32.7*  MCV 88.4 88.6 89.5 88.9  PLT 277 266 261 254   Cardiac Enzymes: No results for input(s): CKTOTAL, CKMB, CKMBINDEX, TROPONINI in the last 168 hours. BNP: Invalid input(s): POCBNP CBG: No results for input(s): GLUCAP in the last 168 hours. D-Dimer Recent Labs    01/15/21 1913  DDIMER 1.80*   Hgb A1c No results for input(s): HGBA1C in the last 72 hours. Lipid Profile No results for input(s): CHOL, HDL, LDLCALC, TRIG, CHOLHDL, LDLDIRECT in the last 72 hours. Thyroid function studies No results for input(s): TSH, T4TOTAL, T3FREE, THYROIDAB in the last 72 hours.  Invalid input(s): FREET3 Anemia work up No results for input(s): VITAMINB12, FOLATE, FERRITIN, TIBC, IRON, RETICCTPCT in the last 72 hours. Urinalysis No results found for: COLORURINE, APPEARANCEUR, LABSPEC, PHURINE, GLUCOSEU, HGBUR, BILIRUBINUR, KETONESUR, PROTEINUR, UROBILINOGEN, NITRITE, LEUKOCYTESUR Sepsis Labs Invalid input(s): PROCALCITONIN,  WBC,  LACTICIDVEN Microbiology Recent Results (from the past 240 hour(s))  Resp Panel by RT-PCR (Flu A&B, Covid) Nasopharyngeal Swab     Status: None   Collection Time: 01/15/21  9:01 PM   Specimen: Nasopharyngeal Swab; Nasopharyngeal(NP) swabs in vial transport medium  Result Value Ref Range Status   SARS Coronavirus 2 by RT PCR NEGATIVE NEGATIVE Final    Comment: (NOTE) SARS-CoV-2 target nucleic acids are NOT DETECTED.  The SARS-CoV-2 RNA is generally detectable in upper respiratory specimens during the acute phase of infection. The lowest concentration of SARS-CoV-2 viral copies this assay can detect is 138 copies/mL. A negative result does not preclude SARS-Cov-2 infection and should not be used as the sole basis for treatment or other patient management decisions. A negative result may occur with  improper specimen collection/handling, submission of specimen other than nasopharyngeal swab,  presence of viral mutation(s) within the areas targeted by this assay, and inadequate number of viral copies(<138 copies/mL). A negative result must be combined with clinical observations, patient history, and epidemiological information. The expected result is Negative.  Fact Sheet for Patients:  BloggerCourse.com  Fact Sheet for Healthcare Providers:  [employer]  This test is no t yet approved or cleared by the [place] FDA and  has been authorized for detection and/or diagnosis of SARS-CoV-2 by FDA under an Emergency Use Authorization (EUA). This EUA will remain  in effect (meaning this test can be used) for the duration of the COVID-19 declaration under Section 564(b)(1) of the Act, 21 U.S.C.section 360bbb-3(b)(1), unless the authorization is terminated  or revoked sooner.       Influenza A by PCR NEGATIVE NEGATIVE Final   Influenza  B by PCR NEGATIVE NEGATIVE Final    Comment: (NOTE) The Xpert Xpress SARS-CoV-2/FLU/RSV plus assay is intended as an aid in the diagnosis of influenza from Nasopharyngeal swab specimens and should not be used as a sole basis for treatment. Nasal washings and aspirates are unacceptable for Xpert Xpress SARS-CoV-2/FLU/RSV testing.  Fact Sheet for Patients: BloggerCourse.com  Fact Sheet for Healthcare Providers: [employer]ker.it  This test is not yet approved or cleared by the [place] FDA and has been authorized for detection and/or diagnosis of SARS-CoV-2 by FDA under an Emergency Use Authorization (EUA). This EUA will remain in effect (meaning this test can be used) for the duration of the COVID-19 declaration under Section 564(b)(1) of the Act, 21 U.S.C. section 360bbb-3(b)(1), unless the authorization is terminated or revoked.  Performed at [facility], [address], [place], [place] [address]       Time coordinating discharge: Over 30 minutes  SIGNED:   Cipriano Bunker, MD  [facility] 01/18/2021, 11:48 AM Pager   If 7PM-7AM, please contact night-coverage [email or web address]

## 2021-01-18 NOTE — TOC Transition Note (Signed)
Transition of Care [facility]) - CM/SW Discharge Note   Patient Details  Name: Carly Garrison MRN: [id number] Date of Birth: 06/28/1976  Transition of [facility]) CM/SW Contact:  Ida Rogue, LCSW Phone Number: 01/18/2021, 11:42 AM   Clinical Narrative:   CSW responding to MD consult for benefits for medication.  Unable to do benefits check today due to holiday.  MD confirmed that pharmacy will counsel patient re: Eliquis and provider her with starter coupons.  No further needs identified.  TOC sign off.    Final next level of care: Home/Self Care Barriers to Discharge: No Barriers Identified   Patient Goals and CMS Choice        Discharge Placement                       Discharge Plan and Services                                     Social Determinants of Health (SDOH) Interventions     Readmission Risk Interventions No flowsheet data found.

## 2021-01-19 LAB — HOMOCYSTEINE: Homocysteine: 5.9 umol/L (ref 0.0–14.5)

## 2021-01-20 LAB — CARDIOLIPIN ANTIBODIES, IGG, IGM, IGA
Anticardiolipin IgA: <9 APL U/mL (ref 0–11)
Anticardiolipin IgG: 12 GPL U/mL (ref 0–14)
Anticardiolipin IgM: <9 MPL U/mL (ref 0–12)

## 2021-01-22 LAB — FACTOR 5 LEIDEN

## 2021-01-25 LAB — PROTHROMBIN GENE MUTATION

## 2021-02-11 ENCOUNTER — Other Ambulatory Visit: Payer: Self-pay | Admitting: Adult Medicine

## 2021-02-11 DIAGNOSIS — Z1231 Encounter for screening mammogram for malignant neoplasm of breast: Secondary | ICD-10-CM

## 2021-05-27 ENCOUNTER — Ambulatory Visit
Admission: RE | Admit: 2021-05-27 | Discharge: 2021-05-27 | Disposition: A | Discharge status: Home / Self Care | Payer: BC Managed Care – PPO | Source: Ambulatory Visit | Attending: Adult Medicine | Admitting: Adult Medicine

## 2021-05-27 ENCOUNTER — Other Ambulatory Visit: Payer: Self-pay

## 2021-05-27 DIAGNOSIS — Z1231 Encounter for screening mammogram for malignant neoplasm of breast: Secondary | ICD-10-CM

## 2021-10-10 ENCOUNTER — Other Ambulatory Visit: Payer: Self-pay

## 2021-10-10 ENCOUNTER — Emergency Department (HOSPITAL_BASED_OUTPATIENT_CLINIC_OR_DEPARTMENT_OTHER): Payer: BC Managed Care – PPO

## 2021-10-10 ENCOUNTER — Encounter (HOSPITAL_BASED_OUTPATIENT_CLINIC_OR_DEPARTMENT_OTHER): Payer: Self-pay | Admitting: Emergency Medicine

## 2021-10-10 ENCOUNTER — Emergency Department (HOSPITAL_BASED_OUTPATIENT_CLINIC_OR_DEPARTMENT_OTHER)
Admission: EM | Admit: 2021-10-10 | Discharge: 2021-10-10 | Disposition: A | Discharge status: Home / Self Care | Payer: BC Managed Care – PPO | Attending: Emergency Medicine | Admitting: Emergency Medicine

## 2021-10-10 DIAGNOSIS — M546 Pain in thoracic spine: Secondary | ICD-10-CM

## 2021-10-10 DIAGNOSIS — R0602 Shortness of breath: Secondary | ICD-10-CM | POA: Diagnosis present

## 2021-10-10 DIAGNOSIS — D509 Iron deficiency anemia, unspecified: Secondary | ICD-10-CM | POA: Insufficient documentation

## 2021-10-10 DIAGNOSIS — R079 Chest pain, unspecified: Secondary | ICD-10-CM | POA: Diagnosis not present

## 2021-10-10 DIAGNOSIS — Z20822 Contact with and (suspected) exposure to covid-19: Secondary | ICD-10-CM | POA: Diagnosis not present

## 2021-10-10 DIAGNOSIS — Z7901 Long term (current) use of anticoagulants: Secondary | ICD-10-CM | POA: Insufficient documentation

## 2021-10-10 DIAGNOSIS — Z79899 Other long term (current) drug therapy: Secondary | ICD-10-CM | POA: Diagnosis not present

## 2021-10-10 LAB — CBC WITH DIFFERENTIAL/PLATELET
Abs Immature Granulocytes: 0.04 10*3/uL (ref 0.00–0.07)
Basophils Absolute: 0.1 10*3/uL (ref 0.0–0.1)
Basophils Relative: 1 %
Eosinophils Absolute: 0.2 10*3/uL (ref 0.0–0.5)
Eosinophils Relative: 2 %
HCT: 31.7 % — ABNORMAL LOW (ref 36.0–46.0)
Hemoglobin: 9.3 g/dL — ABNORMAL LOW (ref 12.0–15.0)
Immature Granulocytes: 0 %
Lymphocytes Relative: 26 %
Lymphs Abs: 2.5 10*3/uL (ref 0.7–4.0)
MCH: 20 pg — ABNORMAL LOW (ref 26.0–34.0)
MCHC: 29.3 g/dL — ABNORMAL LOW (ref 30.0–36.0)
MCV: 68 fL — ABNORMAL LOW (ref 80.0–100.0)
Monocytes Absolute: 0.8 10*3/uL (ref 0.1–1.0)
Monocytes Relative: 8 %
Neutro Abs: 6 10*3/uL (ref 1.7–7.7)
Neutrophils Relative %: 63 %
Platelets: 282 10*3/uL (ref 150–400)
RBC: 4.66 MIL/uL (ref 3.87–5.11)
RDW: 20.9 % — ABNORMAL HIGH (ref 11.5–15.5)
WBC: 9.5 10*3/uL (ref 4.0–10.5)
nRBC: 0.2 % (ref 0.0–0.2)

## 2021-10-10 LAB — IRON AND TIBC
Iron: 20 ug/dL — ABNORMAL LOW (ref 28–170)
Saturation Ratios: 5 % — ABNORMAL LOW (ref 10.4–31.8)
TIBC: 403 ug/dL (ref 250–450)
UIBC: 383 ug/dL

## 2021-10-10 LAB — BASIC METABOLIC PANEL
Anion gap: 6 (ref 5–15)
BUN: 8 mg/dL (ref 6–20)
CO2: 22 mmol/L (ref 22–32)
Calcium: 8.8 mg/dL — ABNORMAL LOW (ref 8.9–10.3)
Chloride: 104 mmol/L (ref 98–111)
Creatinine, Ser: 1 mg/dL (ref 0.44–1.00)
GFR, Estimated: >60 mL/min (ref >60)
Glucose, Bld: 94 mg/dL (ref 70–99)
Potassium: 4.2 mmol/L (ref 3.5–5.1)
Sodium: 132 mmol/L — ABNORMAL LOW (ref 135–145)

## 2021-10-10 LAB — RESP PANEL BY RT-PCR (FLU A&B, COVID) ARPGX2
Influenza A by PCR: NEGATIVE
Influenza B by PCR: NEGATIVE
SARS Coronavirus 2 by RT PCR: NEGATIVE

## 2021-10-10 LAB — D-DIMER, QUANTITATIVE: D-Dimer, Quant: 1.11 ug/mL-FEU — ABNORMAL HIGH (ref 0.00–0.50)

## 2021-10-10 MED ORDER — SODIUM CHLORIDE 0.9 % IV BOLUS
1000.0000 mL | Freq: Once | INTRAVENOUS | Status: AC
  Administered 2021-10-10: 1000 mL via INTRAVENOUS

## 2021-10-10 MED ORDER — FERROUS SULFATE 325 (65 FE) MG PO TABS: 325.0000 mg | ORAL_TABLET | Freq: Every day | ORAL | 0 refills | Status: DC

## 2021-10-10 MED ORDER — METHOCARBAMOL 500 MG PO TABS: 500.0000 mg | ORAL_TABLET | Freq: Two times a day (BID) | ORAL | 0 refills | Status: DC

## 2021-10-10 MED ORDER — MORPHINE SULFATE (PF) 2 MG/ML IV SOLN
2.0000 mg | Freq: Once | INTRAVENOUS | Status: AC
  Administered 2021-10-10: 2 mg via INTRAVENOUS
  Filled 2021-10-10: qty 1

## 2021-10-10 MED ORDER — KETOROLAC TROMETHAMINE 15 MG/ML IJ SOLN
15.0000 mg | Freq: Once | INTRAMUSCULAR | Status: AC
  Administered 2021-10-10: 15 mg via INTRAVENOUS
  Filled 2021-10-10: qty 1

## 2021-10-10 MED ORDER — IOHEXOL 350 MG/ML SOLN
100.0000 mL | Freq: Once | INTRAVENOUS | Status: AC | PRN
  Administered 2021-10-10: 100 mL via INTRAVENOUS

## 2021-10-10 NOTE — ED Provider Notes (Signed)
[facility] EMERGENCY DEPARTMENT Provider Note   CSN: [id number] Arrival date & time: 10/10/21  1301     History  Chief Complaint  Patient presents with   Shortness of Breath    Carly Garrison is a 46 y.o. female Is a 46 year old female with a past medical history significant for previous pulmonary embolism not currently on anticoagulation who presents with 24 hours of right-sided back/chest pain, and onset of shortness of breath this morning.  Patient reports that she has not had any significant muscular injury, history of injury of the back.  Patient denies any cough, sore throat, fever, chills, left-sided chest pain, or pain to the arms or legs.  Patient denies any abdominal pain, nausea, vomiting, dysuria, vaginal discharge at this time.  She reports this feels similar to her previous blood clot, denies DVT during previous blood clot.  She does endorse that she may be has some swelling in her legs but no pain.   Shortness of Breath     Home Medications Prior to Admission medications   Medication Sig Start Date End Date Taking? Authorizing Provider  ferrous sulfate 325 (65 FE) MG tablet Take 1 tablet (325 mg total) by mouth daily. 10/10/21  Yes Georganna Maxson H, PA-C  methocarbamol (ROBAXIN) 500 MG tablet Take 1 tablet (500 mg total) by mouth 2 (two) times daily. 10/10/21  Yes Eniyah Eastmond H, PA-C  apixaban (ELIQUIS) 5 MG TABS tablet Take 2 tablets (10 mg total) by mouth 2 (two) times daily. 01/18/21   Cipriano Bunker, MD  apixaban (ELIQUIS) 5 MG TABS tablet Take 1 tablet (5 mg total) by mouth 2 (two) times daily. 01/24/21   Cipriano Bunker, MD  Saccharomyces boulardii (PROBIOTIC) 250 MG CAPS Take 250 mg by mouth daily.    [provider]      Allergies    Patient has no known allergies.    Review of Systems   Review of Systems  Respiratory:  Positive for shortness of breath.    Physical Exam Updated Vital Signs BP 126/81    Pulse 82    Temp 98.4 F  (36.9 C) (Oral)    Resp (!) 21    Ht 5\' 5"  (1.651 m)    Wt 93 kg    LMP 09/26/2021    SpO2 100%    BMI 34.11 kg/m  Physical Exam  ED Results / Procedures / Treatments   Labs (all labs ordered are listed, but only abnormal results are displayed) Labs Reviewed  D-DIMER, QUANTITATIVE - Abnormal; Notable for the following components:      Result Value   D-Dimer, Quant 1.11 (*)    All other components within normal limits  CBC WITH DIFFERENTIAL/PLATELET - Abnormal; Notable for the following components:   Hemoglobin 9.3 (*)    HCT 31.7 (*)    MCV 68.0 (*)    MCH 20.0 (*)    MCHC 29.3 (*)    RDW 20.9 (*)    All other components within normal limits  BASIC METABOLIC PANEL - Abnormal; Notable for the following components:   Sodium 132 (*)    Calcium 8.8 (*)    All other components within normal limits  RESP PANEL BY RT-PCR (FLU A&B, COVID) ARPGX2  IRON AND TIBC    EKG None  Radiology DG Chest 2 View  Result Date: 10/10/2021 CLINICAL DATA:  shob EXAM: CHEST - 2 VIEW COMPARISON:  Jan 15, 2021. FINDINGS: Low lung volumes with mild streaky opacities at the right  lung base. No confluent consolidation. No visible pleural effusions or pneumothorax. Cardiomediastinal silhouette is within normal limits and similar to prior. IMPRESSION: Mild streaky opacities at the right lung base, favor atelectasis. Electronically Signed   By: Feliberto Harts M.D.   On: 10/10/2021 13:49   CT Angio Chest PE W and/or Wo Contrast  Result Date: 10/10/2021 CLINICAL DATA:  Pulmonary embolism (PE) suspected, positive D-dimer EXAM: CT ANGIOGRAPHY CHEST WITH CONTRAST TECHNIQUE: Multidetector CT imaging of the chest was performed using the standard protocol during bolus administration of intravenous contrast. Multiplanar CT image reconstructions and MIPs were obtained to evaluate the vascular anatomy. RADIATION DOSE REDUCTION: This exam was performed according to the departmental dose-optimization program which  includes automated exposure control, adjustment of the mA and/or kV according to patient size and/or use of iterative reconstruction technique. CONTRAST:  OMNIPAQUE IOHEXOL 350 MG/ML SOLN COMPARISON:  CT 01/15/2021. FINDINGS: Cardiovascular: Normal cardiac size.No pericardial disease.Normal size main and branch pulmonary arteries. No evidence of pulmonary embolism.The thoracic aorta is unremarkable. Mediastinum/Nodes: No lymphadenopathy.The thyroid is unremarkable.Esophagus is unremarkable.The trachea is unremarkable. Lungs/Pleura: Bibasilar hypoventilatory changes and atelectasis, right greater than left, similar to prior CT.No suspicious pulmonary nodules or masses.No pleural effusion.No pneumothorax. Upper Abdomen: No acute abnormality. Musculoskeletal: No acute osseous abnormality.No suspicious lytic or blastic lesions. Review of the MIP images confirms the above findings. IMPRESSION: No evidence of pulmonary embolism. Bibasilar hypoventilatory changes and atelectasis, right greater than left, similar to prior CT. Electronically Signed   By: Caprice Renshaw M.D.   On: 10/10/2021 15:57    Procedures Procedures    Medications Ordered in ED Medications  sodium chloride 0.9 % bolus 1,000 mL (0 mLs Intravenous Stopped 10/10/21 1644)  morphine (PF) 2 MG/ML injection 2 mg (2 mg Intravenous Given 10/10/21 1520)  ketorolac (TORADOL) 15 MG/ML injection 15 mg (15 mg Intravenous Given 10/10/21 1520)  iohexol (OMNIPAQUE) 350 MG/ML injection 100 mL (100 mLs Intravenous Contrast Given 10/10/21 1526)    ED Course/ Medical Decision Making/ A&P                           Medical Decision Making Amount and/or Complexity of Data Reviewed Labs: ordered. Radiology: ordered.   This patient presents to the ED for concern of right upper back / side pain, shob that began last night. She reports it feels similar to previous PE, this involves an extensive number of treatment options, and is a complaint that carries with  it a high risk of complications and morbidity. The emergent differential diagnosis prior to evaluation includes, but is not limited to,  PE, pneumonia, acute bronchitis .   Past Medical History / Co-morbidities: Acute PE, obesity  Additional history: External records from outside source obtained and reviewed including previous emergency department visits.  Physical Exam: Physical exam performed. The pertinent findings include: Patient with tenderness to palpation of the right thoracic paraspinous muscles, and right flank.  No significant tenderness to palpation of the chest.  Clear breath sounds bilaterally.  No tenderness to palpation of the abdomen.  Patient in no respiratory distress.  Some tachypnea noted but otherwise stable vital signs.  Lab Tests: I ordered, and personally interpreted labs.  The pertinent results include: CBC with significant anemia, hemoglobin 9.3, MCV of 68.  High suspicion for iron deficiency anemia.  Patient reports that she has heavy periods and she is currently on her period.  BMP significant for hyponatremia sodium 132.  We will replete  with fluids as patient also appears somewhat dehydrated.  RVP negative for COVID, flu.  D-dimer elevated at 1.11.  Will obtain CT of the chest to rule out pulmonary embolism.   Imaging Studies: I ordered imaging studies including plain film radiograph of the chest, CT angio chest PE study. I independently visualized and interpreted imaging which showed no evidence of pulmonary embolism, some atelectasis right greater than left, no other significant intrathoracic abnormality. I agree with the radiologist interpretation.   Cardiac Monitoring:  The patient was maintained on a cardiac monitor.  My attending physician Dr. Silverio Lay viewed and interpreted the cardiac monitored which showed an underlying rhythm of: sinus rhythm   Medications: I ordered medication including morphine, Toradol for pain, fluids for hyponatremia. Reevaluation of the  patient after these medicines showed that the patient improved. I have reviewed the patients home medicines and have made adjustments as needed.  Disposition: This patient is overall well-appearing, has physical exam consistent with a musculoskeletal source of the patient's right-sided back, flank pain.  She has unremarkable lab work other than her significant anemia, hyponatremia, as well as overall unremarkable CT PE study, chest x-ray.  Considering that she has atelectasis on side it is possible that she is favoring shallow respiration secondary to recurrent muscular spasm, or secondary to her history of PE.  Discussed with patient that I would recommend trial of Tylenol, ibuprofen, muscle relaxant, and follow-up with PCP.  Discussed with patient that her iron and TIBC are pending but I would recommend that she start on iron supplementation at this time given high suspicion for iron deficiency anemia.  Patient discharged in stable condition at this time, return precautions given.   Final Clinical Impression(s) / ED Diagnoses Final diagnoses:  Shortness of breath  Right-sided chest pain  Acute right-sided thoracic back pain  Iron deficiency anemia, unspecified iron deficiency anemia type    Rx / DC Orders ED Discharge Orders          Ordered    methocarbamol (ROBAXIN) 500 MG tablet  2 times daily        10/10/21 1638    ferrous sulfate 325 (65 FE) MG tablet  Daily        10/10/21 1638              Mateen Franssen, Harrel Carina, PA-C 10/10/21 1651    Benjiman Core, MD 10/11/21 [phone]

## 2021-10-10 NOTE — Discharge Instructions (Addendum)
Please use Tylenol or ibuprofen for pain.  You may use 600 mg ibuprofen every 6 hours or 1000 mg of Tylenol every 6 hours.  You may choose to alternate between the 2.  This would be most effective.  Not to exceed 4 g of Tylenol within 24 hours.  Not to exceed 3200 mg ibuprofen 24 hours.  Addition to the above you can use the muscle relaxant that I prescribed.  You findings as we discussed you are not consistent with a blood clot, based on my physical exam I have some suspicion that it may be related to muscle spasm.  Additionally as we discussed your hemoglobin was low, and I have suspicion that you have some iron deficiency anemia.  I recommend that you start taking some iron supplementation daily.  Please follow-up with your primary care for further recommendations, and to ensure that your symptoms are improving.  I sent you in an iron supplement along with the muscle relaxant, however this is something that you can get over-the-counter if it is cheaper.

## 2021-10-10 NOTE — ED Triage Notes (Signed)
Pt arrives pov, c/o right side upper back pain and shob starting acutely last night. Took 1 gram tylenol at 1100 with relief, denies fever, hx of PE

## 2022-02-10 ENCOUNTER — Emergency Department (HOSPITAL_BASED_OUTPATIENT_CLINIC_OR_DEPARTMENT_OTHER)
Admission: EM | Admit: 2022-02-10 | Discharge: 2022-02-10 | Disposition: A | Discharge status: Home / Self Care | Payer: Managed Care, Other (non HMO) | Attending: Emergency Medicine | Admitting: Emergency Medicine

## 2022-02-10 ENCOUNTER — Other Ambulatory Visit: Payer: Self-pay

## 2022-02-10 ENCOUNTER — Emergency Department (HOSPITAL_BASED_OUTPATIENT_CLINIC_OR_DEPARTMENT_OTHER): Payer: Managed Care, Other (non HMO)

## 2022-02-10 ENCOUNTER — Encounter (HOSPITAL_BASED_OUTPATIENT_CLINIC_OR_DEPARTMENT_OTHER): Payer: Self-pay | Admitting: Pediatrics

## 2022-02-10 DIAGNOSIS — R0602 Shortness of breath: Secondary | ICD-10-CM | POA: Diagnosis present

## 2022-02-10 DIAGNOSIS — I2694 Multiple subsegmental pulmonary emboli without acute cor pulmonale: Secondary | ICD-10-CM | POA: Diagnosis not present

## 2022-02-10 DIAGNOSIS — Z7901 Long term (current) use of anticoagulants: Secondary | ICD-10-CM | POA: Insufficient documentation

## 2022-02-10 DIAGNOSIS — R079 Chest pain, unspecified: Secondary | ICD-10-CM

## 2022-02-10 LAB — CBC WITH DIFFERENTIAL/PLATELET
Abs Immature Granulocytes: 0.03 10*3/uL (ref 0.00–0.07)
Basophils Absolute: 0 10*3/uL (ref 0.0–0.1)
Basophils Relative: 1 %
Eosinophils Absolute: 0.1 10*3/uL (ref 0.0–0.5)
Eosinophils Relative: 1 %
HCT: 39 % (ref 36.0–46.0)
Hemoglobin: 12.4 g/dL (ref 12.0–15.0)
Immature Granulocytes: 0 %
Lymphocytes Relative: 27 %
Lymphs Abs: 1.9 10*3/uL (ref 0.7–4.0)
MCH: 26.6 pg (ref 26.0–34.0)
MCHC: 31.8 g/dL (ref 30.0–36.0)
MCV: 83.5 fL (ref 80.0–100.0)
Monocytes Absolute: 0.5 10*3/uL (ref 0.1–1.0)
Monocytes Relative: 7 %
Neutro Abs: 4.5 10*3/uL (ref 1.7–7.7)
Neutrophils Relative %: 64 %
Platelets: 323 10*3/uL (ref 150–400)
RBC: 4.67 MIL/uL (ref 3.87–5.11)
RDW: 17.2 % — ABNORMAL HIGH (ref 11.5–15.5)
WBC: 7.1 10*3/uL (ref 4.0–10.5)
nRBC: 0 % (ref 0.0–0.2)

## 2022-02-10 LAB — COMPREHENSIVE METABOLIC PANEL
ALT: 12 U/L (ref 0–44)
AST: 13 U/L — ABNORMAL LOW (ref 15–41)
Albumin: 3.5 g/dL (ref 3.5–5.0)
Alkaline Phosphatase: 61 U/L (ref 38–126)
Anion gap: 6 (ref 5–15)
BUN: 9 mg/dL (ref 6–20)
CO2: 24 mmol/L (ref 22–32)
Calcium: 9.1 mg/dL (ref 8.9–10.3)
Chloride: 106 mmol/L (ref 98–111)
Creatinine, Ser: 0.8 mg/dL (ref 0.44–1.00)
GFR, Estimated: >60 mL/min (ref >60)
Glucose, Bld: 112 mg/dL — ABNORMAL HIGH (ref 70–99)
Potassium: 3.9 mmol/L (ref 3.5–5.1)
Sodium: 136 mmol/L (ref 135–145)
Total Bilirubin: 0.6 mg/dL (ref 0.3–1.2)
Total Protein: 7.9 g/dL (ref 6.5–8.1)

## 2022-02-10 LAB — TROPONIN I (HIGH SENSITIVITY)
Troponin I (High Sensitivity): <2 ng/L (ref <18)
Troponin I (High Sensitivity): <2 ng/L (ref <18)

## 2022-02-10 LAB — D-DIMER, QUANTITATIVE: D-Dimer, Quant: 0.91 ug/mL-FEU — ABNORMAL HIGH (ref 0.00–0.50)

## 2022-02-10 MED ORDER — APIXABAN (ELIQUIS) VTE STARTER PACK (10MG AND 5MG): ORAL_TABLET | ORAL | 0 refills | Status: DC

## 2022-02-10 MED ORDER — APIXABAN 2.5 MG PO TABS
10.0000 mg | ORAL_TABLET | Freq: Once | ORAL | Status: AC
  Administered 2022-02-10: 10 mg via ORAL
  Filled 2022-02-10: qty 4

## 2022-02-10 MED ORDER — IOHEXOL 350 MG/ML SOLN
80.0000 mL | Freq: Once | INTRAVENOUS | Status: AC | PRN
  Administered 2022-02-10: 80 mL via INTRAVENOUS

## 2022-02-10 MED ORDER — MORPHINE SULFATE (PF) 4 MG/ML IV SOLN
4.0000 mg | Freq: Once | INTRAVENOUS | Status: AC
Start: 2022-02-10 — End: 2022-02-10
  Administered 2022-02-10: 4 mg via INTRAVENOUS
  Filled 2022-02-10: qty 1

## 2022-02-10 MED ORDER — OXYCODONE-ACETAMINOPHEN 5-325 MG PO TABS: 1.0000 | ORAL_TABLET | ORAL | 0 refills | Status: DC | PRN

## 2022-02-10 MED ORDER — ALBUTEROL SULFATE HFA 108 (90 BASE) MCG/ACT IN AERS: 2.0000 | INHALATION_SPRAY | RESPIRATORY_TRACT | Status: DC | PRN

## 2022-02-10 NOTE — Discharge Instructions (Signed)
Your history, exam, work-up today confirmed bilateral pulmonary emboli as the cause of your symptoms today.  Your other work-up was reassuring in regards to your troponins and imaging.  There is no evidence of heart strain.  Your vital signs are reassuring.  We feel you are safe for discharge but please start taking the Eliquis again and use the pain medicine help with discomfort.  Please follow-up with your primary doctor as soon as possible to get back on your regimen.  Please rest and stay hydrated.  If any symptoms change or worsen acutely, please turn to the nearest Emergency Department.

## 2022-02-10 NOTE — ED Triage Notes (Signed)
C/O left sided pain along w/ shortness of breathe; reported hx of blood clots w/ similar symptoms a year ago.

## 2022-02-11 ENCOUNTER — Other Ambulatory Visit (HOSPITAL_BASED_OUTPATIENT_CLINIC_OR_DEPARTMENT_OTHER): Payer: Self-pay

## 2022-02-11 ENCOUNTER — Telehealth (HOSPITAL_BASED_OUTPATIENT_CLINIC_OR_DEPARTMENT_OTHER): Payer: Self-pay | Admitting: Emergency Medicine

## 2022-02-11 MED ORDER — RIVAROXABAN (XARELTO) VTE STARTER PACK (15 & 20 MG)
ORAL_TABLET | ORAL | 0 refills | Status: DC
  Filled 2022-02-11: qty 51, 30d supply, fill #0

## 2022-02-11 NOTE — ED Notes (Signed)
Phone call received from client, stated that [facility] was unable to fill her Rx for Apixaban due to "paper work for [employer]", spoke with Primary ED MD on duty today and spoke with [facility], was able to have Rx transferred to [facility] who will aid in providing Rx to client and provided education information as well. Pt was called back and notified of Rx being transferred.

## 2022-04-19 ENCOUNTER — Encounter: Payer: Self-pay | Admitting: Internal Medicine

## 2022-04-19 ENCOUNTER — Other Ambulatory Visit: Payer: Self-pay

## 2022-04-19 ENCOUNTER — Ambulatory Visit: Payer: Managed Care, Other (non HMO) | Attending: Internal Medicine | Admitting: Internal Medicine

## 2022-04-19 VITALS — BP 117/80 | HR 76 | Temp 98.1°F | Ht 66.0 in | Wt 203.0 lb

## 2022-04-19 DIAGNOSIS — Z23 Encounter for immunization: Secondary | ICD-10-CM

## 2022-04-19 DIAGNOSIS — E669 Obesity, unspecified: Secondary | ICD-10-CM

## 2022-04-19 DIAGNOSIS — K5909 Other constipation: Secondary | ICD-10-CM | POA: Diagnosis not present

## 2022-04-19 DIAGNOSIS — Z7689 Persons encountering health services in other specified circumstances: Secondary | ICD-10-CM

## 2022-04-19 DIAGNOSIS — Z6832 Body mass index (BMI) 32.0-32.9, adult: Secondary | ICD-10-CM

## 2022-04-19 DIAGNOSIS — I2699 Other pulmonary embolism without acute cor pulmonale: Secondary | ICD-10-CM | POA: Diagnosis not present

## 2022-04-19 MED ORDER — APIXABAN 5 MG PO TABS
5.0000 mg | ORAL_TABLET | Freq: Two times a day (BID) | ORAL | 5 refills | Status: DC
  Filled 2022-04-19 (×2): qty 60, 30d supply, fill #0
  Filled 2022-05-14: qty 60, 30d supply, fill #1
  Filled 2022-06-21: qty 60, 30d supply, fill #2
  Filled 2022-07-12 – 2022-07-13 (×2): qty 60, 30d supply, fill #3
  Filled 2022-08-22: qty 60, 30d supply, fill #4
  Filled 2022-09-22: qty 60, 30d supply, fill #5

## 2022-04-19 NOTE — Progress Notes (Signed)
Patient ID: Carly Garrison, female    DOB: 1976-01-15  MRN: [id number]  CC: Hospitalization Follow-up   Subjective: Carly Garrison is a 46 y.o. female who presents for new hosp f/u Her concerns today include:  Pt with hx of recurrent PE, obesity,   Pt here to est care She was seeing an MD at [facility] on [address] for primary care.  Decided to change because she was not pleased with care Hx of recurrent P.E.  Had PE 12/2020 for first time; on Eliquis x 6 mths. Off Eliquis 07/2022.  Coagulopathy work-up negative except protein C and protein S levels were not checked. Had recurrent PE BL 01/2022; seen in ER. Dischg on Eliquis. Took last dose yesterday.  No bruising or bleeding on Eliquis.  Eliquis very expensive even with insurance; co-pay $300.  Eliquis not on Good Rx. Not on any BCP or shots.  Prior to this episode no long distance travel Fhx of blood clot in sister No, CP, SOB, dizziness, bruising or bleeding.  Reports some constipation since being on Eliquis.  Tried Miralax.  Also used something else her mom gave her that she had used prior to c-scope but does not recall the name.  Helped for 1 day.    Obesity:  appetite dec since being on Eliquis.  Loves sodas.  Drinks 2 can sodas a day.  Does a lot of walking on her job.  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    Patient Active Problem List   Diagnosis Date Noted   Leukocytosis 01/16/2021   Obesity 01/16/2021   Acute pulmonary embolism (HCC) 01/15/2021     Current Outpatient Medications on File Prior to Visit  Medication Sig Dispense Refill   apixaban (ELIQUIS) 5 MG TABS tablet Take 2 tablets (10 mg total) by mouth 2 (two) times daily. 26 tablet 0   apixaban (ELIQUIS) 5 MG TABS tablet Take 1 tablet (5 mg total) by mouth 2 (two) times daily. 60 tablet 5   ferrous sulfate 325 (65 FE) MG tablet Take 1 tablet (325 mg total) by mouth daily. 30 tablet 0   methocarbamol (ROBAXIN) 500 MG tablet Take 1 tablet (500 mg  total) by mouth 2 (two) times daily. 20 tablet 0   oxyCODONE-acetaminophen (PERCOCET/ROXICET) 5-325 MG tablet Take 1 tablet by mouth every 4 (four) hours as needed for severe pain. 15 tablet 0   RIVAROXABAN (XARELTO) VTE STARTER PACK (15 & 20 MG) Follow package instructions. On days 1 - 21 take one 15mg  tablet 2 times daily, then on day 22 switch to one 20mg  tablet once daily. **Follow up with PCP** 51 each 0   Saccharomyces boulardii (PROBIOTIC) 250 MG CAPS Take 250 mg by mouth daily.     No current facility-administered medications on file prior to visit.    No Known Allergies  Social History   Socioeconomic History   Marital status: Married    Spouse name: Not on file   Number of children: Not on file   Years of education: Not on file   Highest education level: Not on file  Occupational History   Not on file  Tobacco Use   Smoking status: Never   Smokeless tobacco: Never  Substance and Sexual Activity   Alcohol use: Not Currently    Comment: wine, occasionally   Drug use: No   Sexual activity: Yes    Birth control/protection: None  Other Topics Concern   Not on file  Social History Narrative   Not on file   Social Determinants of Health   Financial Resource Strain: Not on file  Food Insecurity: Not on file  Transportation Needs: Not on file  Physical Activity: Not on file  Stress: Not on file  Social Connections: Not on file  Intimate Partner Violence: Not on file    Family History  Problem Relation Age of Onset   Clotting disorder Father    Clotting disorder Sister    Breast cancer Neg Hx     No past surgical history on file.  ROS: Review of Systems Negative except as stated above  PHYSICAL EXAM: BP 117/80   Pulse 76   Temp 98.1 F (36.7 C) (Oral)   Ht 5\' 6"  (1.676 m)   Wt 203 lb (92.1 kg)   SpO2 100%   BMI 32.77 kg/m   Wt Readings from Last 3 Encounters:  04/19/22 203 lb (92.1 kg)  02/10/22 210 lb (95.3 kg)  10/10/21 205 lb (93 kg)     Physical Exam  General appearance - alert, well appearing, mildly obese middle-age African-American female and in no distress Mental status - normal mood, behavior, speech, dress, motor activity, and thought processes Eyes - pupils equal and reactive, extraocular eye movements intact Neck - supple, no significant adenopathy Chest - clear to auscultation, no wheezes, rales or rhonchi, symmetric air entry Heart - normal rate, regular rhythm, normal S1, S2, no murmurs, rubs, clicks or gallops Extremities - peripheral pulses normal, no pedal edema, no clubbing or cyanosis     04/19/2022    9:49 AM  Depression screen PHQ 2/9  Decreased Interest 0  Down, Depressed, Hopeless 0  PHQ - 2 Score 0  Altered sleeping 0  Tired, decreased energy 1  Change in appetite 1  Feeling bad or failure about yourself  0  Trouble concentrating 0  Moving slowly or fidgety/restless 0  Suicidal thoughts 0  PHQ-9 Score 2      04/19/2022    9:58 AM  GAD 7 : Generalized Anxiety Score  Nervous, Anxious, on Edge 0  Control/stop worrying 0  Worry too much - different things 1  Trouble relaxing 0  Restless 0  Easily annoyed or irritable 0  Afraid - awful might happen 0  Total GAD 7 Score 1        Latest Ref Rng & Units 02/10/2022    6:32 PM 10/10/2021    1:25 PM 01/15/2021    7:13 PM  CMP  Glucose 70 - 99 mg/dL 01/17/2021  94  932   BUN 6 - 20 mg/dL 9  8  11    Creatinine 0.44 - 1.00 mg/dL 671   2.45   Sodium 135 - 145 mmol/L 136  132  134   Potassium 3.5 - 5.1 mmol/L 3.9  4.2  4.0   Chloride 98 - 111 mmol/L 106  104  103   CO2 22 - 32 mmol/L 24  22  22    Calcium 8.9 - 10.3 mg/dL 9.1  8.8  9.2   Total Protein 6.5 - 8.1 g/dL 7.9   8.5   Total Bilirubin 0.3 - 1.2 mg/dL 0.6   0.4   Alkaline Phos 38 - 126 U/L 61   62   AST 15 - 41 U/L 13   10   ALT 0 - 44 U/L 12   10     CBC    Component Value Date/Time   WBC 7.1 02/10/2022 1832  RBC 4.67 02/10/2022 1832   HGB 12.4 02/10/2022 1832   HCT  39.0 02/10/2022 1832   PLT 323 02/10/2022 1832   MCV 83.5 02/10/2022 1832   MCH 26.6 02/10/2022 1832   MCHC 31.8 02/10/2022 1832   RDW 17.2 (H) 02/10/2022 1832   LYMPHSABS 1.9 02/10/2022 1832   MONOABS 0.5 02/10/2022 1832   EOSABS 0.1 02/10/2022 1832   BASOSABS 0.0 02/10/2022 1832    ASSESSMENT AND PLAN: 1. Establishing care with new doctor, encounter for   2. Recurrent pulmonary emboli (HCC) Given that this is the second episode within 20 months unprovoked, I recommend lifelong anticoagulation therapy.  I spoke with our clinical pharmacist and he will work on trying to get the Eliquis at a lower co-pay for her today.   Advised to report any excessive bruising or if she has any bleeding from the gums, in the urine, epistaxis or rectal while on Eliquis.   Try to avoid falls and other trauma while on Eliquis as this can cause serious bleeding.  Check CBC and chemistry today.. - CBC - Basic metabolic panel - apixaban (ELIQUIS) 5 MG TABS tablet; Take 1 tablet (5 mg total) by mouth 2 (two) times daily.  Dispense: 60 tablet; Refill: 5  3. Obesity (BMI 30.0-34.9) Patient advised to eliminate sugary drinks from the diet, cut back on portion sizes especially of white carbohydrates, eat more white lean meat like chicken [place] and seafood instead of beef or pork and incorporate fresh fruits and vegetables into the diet daily. Advised that the goal is to get in about 150 minutes/week total of moderate intensity exercise.  Advised putting an app on her phone that we will track her daily steps. - Hemoglobin A1c  4. Other constipation Discussed the importance of incorporating several servings of fruits and some green leafy vegetables into the diet daily as these are naturally high in fiber.  Also recommend drinking a little bit of prune juice 2-3 times a week to help keep bowel movements regular.  Drink adequate amount of water during the day.  5. Need for immunization against influenza - Flu  Vaccine QUAD 46mo+IM (Fluarix, Fluzone & Alfiuria Quad PF)   Patient was given the opportunity to ask questions.  Patient verbalized understanding of the plan and was able to repeat key elements of the plan.   This documentation was completed using Paediatric nurse.  Any transcriptional errors are unintentional.  No orders of the defined types were placed in this encounter.    Requested Prescriptions    No prescriptions requested or ordered in this encounter    No follow-ups on file.  Jonah Blue, MD, FACP

## 2022-04-19 NOTE — Patient Instructions (Signed)
Healthy Eating Following a healthy eating pattern may help you to achieve and maintain a healthy body weight, reduce the risk of chronic disease, and live a long and productive life. It is important to follow a healthy eating pattern at an appropriate calorie level for your body. Your nutritional needs should be met primarily through food by choosing a variety of nutrient-rich foods. What are tips for following this plan? Reading food labels Read labels and choose the following: Reduced or low sodium. Juices with 100% fruit juice. Foods with low saturated fats and high polyunsaturated and monounsaturated fats. Foods with whole grains, such as whole wheat, cracked wheat, brown rice, and wild rice. Whole grains that are fortified with folic acid. This is recommended for women who are pregnant or who want to become pregnant. Read labels and avoid the following: Foods with a lot of added sugars. These include foods that contain brown sugar, corn sweetener, corn syrup, dextrose, fructose, glucose, high-fructose corn syrup, honey, invert sugar, lactose, malt syrup, maltose, molasses, raw sugar, sucrose, trehalose, or turbinado sugar. Do not eat more than the following amounts of added sugar per day: 6 teaspoons (25 g) for women. 9 teaspoons (38 g) for men. Foods that contain processed or refined starches and grains. Refined grain products, such as white flour, degermed cornmeal, white bread, and white rice. Shopping Choose nutrient-rich snacks, such as vegetables, whole fruits, and nuts. Avoid high-calorie and high-sugar snacks, such as potato chips, fruit snacks, and candy. Use oil-based dressings and spreads on foods instead of solid fats such as butter, stick margarine, or cream cheese. Limit pre-made sauces, mixes, and "instant" products such as flavored rice, instant noodles, and ready-made pasta. Try more plant-protein sources, such as tofu, tempeh, black beans, edamame, lentils, nuts, and  seeds. Explore eating plans such as the Mediterranean diet or vegetarian diet. Cooking Use oil to saut or stir-fry foods instead of solid fats such as butter, stick margarine, or lard. Try baking, boiling, grilling, or broiling instead of frying. Remove the fatty part of meats before cooking. Steam vegetables in water or broth. Meal planning  At meals, imagine dividing your plate into fourths: One-half of your plate is fruits and vegetables. One-fourth of your plate is whole grains. One-fourth of your plate is protein, especially lean meats, poultry, eggs, tofu, beans, or nuts. Include low-fat dairy as part of your daily diet. Lifestyle Choose healthy options in all settings, including home, work, school, restaurants, or stores. Prepare your food safely: Wash your hands after handling raw meats. Keep food preparation surfaces clean by regularly washing with hot, soapy water. Keep raw meats separate from ready-to-eat foods, such as fruits and vegetables. Cook seafood, meat, poultry, and eggs to the recommended internal temperature. Store foods at safe temperatures. In general: Keep cold foods at 40F (4.4C) or below. Keep hot foods at 140F (60C) or above. Keep your freezer at 0F (-17.8C) or below. Foods are no longer safe to eat when they have been between the temperatures of 40-140F (4.4-60C) for more than 2 hours. What foods should I eat? Fruits Aim to eat 2 cup-equivalents of fresh, canned (in natural juice), or frozen fruits each day. Examples of 1 cup-equivalent of fruit include 1 small apple, 8 large strawberries, 1 cup canned fruit,  cup dried fruit, or 1 cup 100% juice. Vegetables Aim to eat 2-3 cup-equivalents of fresh and frozen vegetables each day, including different varieties and colors. Examples of 1 cup-equivalent of vegetables include 2 medium carrots, 2 cups raw,   leafy greens, 1 cup chopped vegetable (raw or cooked), or 1 medium baked potato. Grains Aim to  eat 6 ounce-equivalents of whole grains each day. Examples of 1 ounce-equivalent of grains include 1 slice of bread, 1 cup ready-to-eat cereal, 3 cups popcorn, or  cup cooked rice, pasta, or cereal. Meats and other proteins Aim to eat 5-6 ounce-equivalents of protein each day. Examples of 1 ounce-equivalent of protein include 1 egg, 1/2 cup nuts or seeds, or 1 tablespoon (16 g) peanut butter. A cut of meat or fish that is the size of a deck of cards is about 3-4 ounce-equivalents. Of the protein you eat each week, try to have at least 8 ounces come from seafood. This includes salmon, trout, herring, and anchovies. Dairy Aim to eat 3 cup-equivalents of fat-free or low-fat dairy each day. Examples of 1 cup-equivalent of dairy include 1 cup (240 mL) milk, 8 ounces (250 g) yogurt, 1 ounces (44 g) natural cheese, or 1 cup (240 mL) fortified soy milk. Fats and oils Aim for about 5 teaspoons (21 g) per day. Choose monounsaturated fats, such as canola and olive oils, avocados, peanut butter, and most nuts, or polyunsaturated fats, such as sunflower, corn, and soybean oils, walnuts, pine nuts, sesame seeds, sunflower seeds, and flaxseed. Beverages Aim for six 8-oz glasses of water per day. Limit coffee to three to five 8-oz cups per day. Limit caffeinated beverages that have added calories, such as soda and energy drinks. Limit alcohol intake to no more than 1 drink a day for nonpregnant women and 2 drinks a day for men. One drink equals 12 oz of beer (355 mL), 5 oz of wine (148 mL), or 1 oz of hard liquor (44 mL). Seasoning and other foods Avoid adding excess amounts of salt to your foods. Try flavoring foods with herbs and spices instead of salt. Avoid adding sugar to foods. Try using oil-based dressings, sauces, and spreads instead of solid fats. This information is based on general [place] nutrition guidelines. For more information, visit [employer]es. Exact amounts may vary based on your nutrition  needs. Summary A healthy eating plan may help you to maintain a healthy weight, reduce the risk of chronic diseases, and stay active throughout your life. Plan your meals. Make sure you eat the right portions of a variety of nutrient-rich foods. Try baking, boiling, grilling, or broiling instead of frying. Choose healthy options in all settings, including home, work, school, restaurants, or stores. This information is not intended to replace advice given to you by your health care provider. Make sure you discuss any questions you have with your health care provider. Document Revised: 04/06/2021 Document Reviewed: 04/06/2021 Elsevier Patient Education  [place].  Constipation, Adult Constipation is when a person has fewer than three bowel movements in a week, has difficulty having a bowel movement, or has stools (feces) that are dry, hard, or larger than normal. Constipation may be caused by an underlying condition. It may become worse with age if a person takes certain medicines and does not take in enough fluids. Follow these instructions at home: Eating and drinking  Eat foods that have a lot of fiber, such as beans, whole grains, and fresh fruits and vegetables. Limit foods that are low in fiber and high in fat and processed sugars, such as fried or sweet foods. These include french fries, hamburgers, cookies, candies, and soda. Drink enough fluid to keep your urine pale yellow. General instructions Exercise regularly or as told by your  health care provider. Try to do 150 minutes of moderate exercise each week. Use the bathroom when you have the urge to go. Do not hold it in. Take over-the-counter and prescription medicines only as told by your health care provider. This includes any fiber supplements. During bowel movements: Practice deep breathing while relaxing the lower abdomen. Practice pelvic floor relaxation. Watch your condition for any changes. Let your health care  provider know about them. Keep all follow-up visits as told by your health care provider. This is important. Contact a health care provider if: You have pain that gets worse. You have a fever. You do not have a bowel movement after 4 days. You vomit. You are not hungry or you lose weight. You are bleeding from the opening between the buttocks (anus). You have thin, pencil-like stools. Get help right away if: You have a fever and your symptoms suddenly get worse. You leak stool or have blood in your stool. Your abdomen is bloated. You have severe pain in your abdomen. You feel dizzy or you faint. Summary Constipation is when a person has fewer than three bowel movements in a week, has difficulty having a bowel movement, or has stools (feces) that are dry, hard, or larger than normal. Eat foods that have a lot of fiber, such as beans, whole grains, and fresh fruits and vegetables. Drink enough fluid to keep your urine pale yellow. Take over-the-counter and prescription medicines only as told by your health care provider. This includes any fiber supplements. This information is not intended to replace advice given to you by your health care provider. Make sure you discuss any questions you have with your health care provider. Document Revised: 06/26/2019 Document Reviewed: 06/26/2019 Elsevier Patient Education  [place].

## 2022-04-20 LAB — BASIC METABOLIC PANEL
BUN/Creatinine Ratio: 12 (ref 9–23)
BUN: 10 mg/dL (ref 6–24)
CO2: 19 mmol/L — ABNORMAL LOW (ref 20–29)
Calcium: 8.8 mg/dL (ref 8.7–10.2)
Chloride: 105 mmol/L (ref 96–106)
Creatinine, Ser: 0.86 mg/dL (ref 0.57–1.00)
Glucose: 91 mg/dL (ref 70–99)
Potassium: 4.4 mmol/L (ref 3.5–5.2)
Sodium: 138 mmol/L (ref 134–144)
eGFR: 85 mL/min/{1.73_m2} (ref >59)

## 2022-04-20 LAB — CBC
Hematocrit: 35.3 % (ref 34.0–46.6)
Hemoglobin: 11.2 g/dL (ref 11.1–15.9)
MCH: 26 pg — ABNORMAL LOW (ref 26.6–33.0)
MCHC: 31.7 g/dL (ref 31.5–35.7)
MCV: 82 fL (ref 79–97)
Platelets: 270 10*3/uL (ref 150–450)
RBC: 4.31 x10E6/uL (ref 3.77–5.28)
RDW: 14.3 % (ref 11.7–15.4)
WBC: 6 10*3/uL (ref 3.4–10.8)

## 2022-04-20 LAB — HEMOGLOBIN A1C
Est. average glucose Bld gHb Est-mCnc: 105 mg/dL
Hgb A1c MFr Bld: 5.3 % (ref 4.8–5.6)

## 2022-05-16 ENCOUNTER — Other Ambulatory Visit: Payer: Self-pay

## 2022-05-19 ENCOUNTER — Other Ambulatory Visit: Payer: Self-pay

## 2022-06-02 ENCOUNTER — Ambulatory Visit: Payer: Managed Care, Other (non HMO) | Attending: Internal Medicine | Admitting: Internal Medicine

## 2022-06-02 ENCOUNTER — Other Ambulatory Visit (HOSPITAL_COMMUNITY)
Admission: RE | Admit: 2022-06-02 | Discharge: 2022-06-02 | Disposition: A | Discharge status: Home / Self Care | Payer: Managed Care, Other (non HMO) | Source: Ambulatory Visit | Attending: Internal Medicine | Admitting: Internal Medicine

## 2022-06-02 ENCOUNTER — Encounter: Payer: Self-pay | Admitting: Internal Medicine

## 2022-06-02 VITALS — BP 121/84 | HR 82 | Ht 65.0 in | Wt 215.6 lb

## 2022-06-02 DIAGNOSIS — N946 Dysmenorrhea, unspecified: Secondary | ICD-10-CM

## 2022-06-02 DIAGNOSIS — Z124 Encounter for screening for malignant neoplasm of cervix: Secondary | ICD-10-CM | POA: Insufficient documentation

## 2022-06-02 DIAGNOSIS — Z1231 Encounter for screening mammogram for malignant neoplasm of breast: Secondary | ICD-10-CM

## 2022-06-02 DIAGNOSIS — N760 Acute vaginitis: Secondary | ICD-10-CM | POA: Insufficient documentation

## 2022-06-02 DIAGNOSIS — Z1211 Encounter for screening for malignant neoplasm of colon: Secondary | ICD-10-CM | POA: Diagnosis not present

## 2022-06-02 DIAGNOSIS — N920 Excessive and frequent menstruation with regular cycle: Secondary | ICD-10-CM

## 2022-06-02 DIAGNOSIS — D649 Anemia, unspecified: Secondary | ICD-10-CM

## 2022-06-02 MED ORDER — TRAMADOL HCL 50 MG PO TABS: 50.0000 mg | ORAL_TABLET | Freq: Three times a day (TID) | ORAL | 1 refills | Status: DC | PRN

## 2022-06-02 NOTE — Progress Notes (Signed)
Pt wants to speak w/ pcp about cramps  Pt stats that her cramps are severe  Pain mainly on Lt during periods.

## 2022-06-02 NOTE — Progress Notes (Signed)
Patient ID: Carly Garrison, female    DOB: 03/31/1976  MRN: [id number]  CC: GYN exam  Subjective: Carly Garrison is a 46 y.o. female who presents for pap Her concerns today include:  Pt with hx of recurrent PE (12/2020 and 01/2022), obesity,   GYN History:  Pt is G4P3 (1 miscarriage) Any hx of abn paps?:no Menses regular or irregular?: regular How long does menses last?  8 days Menstrual flow light or heavy?: heavy 1st 6 days.  Has to wear Depends to work and at bedtime.  Heavy since being on Eliquis.  Very painful menstrual cramps worse for first 4-5 days.  Takes Tylenol and uses heating pad which do not help much.  She has history of anemia.  States she was on iron tablets in the past by prescription but now she purchased over-the-counter and is taking 1 daily. Method of birth control?:  tubal ligation Any vaginal dischg at this time?: no Dysuria?: no Any hx of STI?:  no Sexually active with how many partners: not currently; last was 1 yr ago Desires STI screen:  yes Last MMG: 05/2021 Family hx of uterine, cervical or breast cancer?:  no  HM:  due for colon CA screen.  No fhx of colon CA.  Needs COVID booster  Patient Active Problem List   Diagnosis Date Noted   Other constipation 04/19/2022   Leukocytosis 01/16/2021   Obesity (BMI 30.0-34.9) 01/16/2021   Recurrent pulmonary emboli (HCC) 01/15/2021     Current Outpatient Medications on File Prior to Visit  Medication Sig Dispense Refill   apixaban (ELIQUIS) 5 MG TABS tablet Take 1 tablet (5 mg total) by mouth 2 (two) times daily. 60 tablet 5   Saccharomyces boulardii (PROBIOTIC) 250 MG CAPS Take 250 mg by mouth daily. (Patient not taking: Reported on 06/02/2022)     No current facility-administered medications on file prior to visit.    No Known Allergies  Social History   Socioeconomic History   Marital status: Married    Spouse name: Not on file   Number of children: Not on file   Years of education: Not on file    Highest education level: Not on file  Occupational History   Not on file  Tobacco Use   Smoking status: Never   Smokeless tobacco: Never  Substance and Sexual Activity   Alcohol use: Not Currently    Comment: wine, occasionally   Drug use: No   Sexual activity: Yes    Birth control/protection: None  Other Topics Concern   Not on file  Social History Narrative   Not on file   Social Determinants of Health   Financial Resource Strain: Not on file  Food Insecurity: Not on file  Transportation Needs: Not on file  Physical Activity: Not on file  Stress: Not on file  Social Connections: Not on file  Intimate Partner Violence: Not on file    Family History  Problem Relation Age of Onset   Clotting disorder Father    Clotting disorder Sister    Breast cancer Neg Hx     No past surgical history on file.  ROS: Review of Systems Negative except as stated above  PHYSICAL EXAM: BP 121/84   Pulse 82   Ht 5\' 5"  (1.651 m)   Wt 215 lb 9.6 oz (97.8 kg)   LMP 05/17/2022   SpO2 98%   BMI 35.88 kg/m   Physical Exam  General appearance - alert, well appearing, and in no  distress Mental status - normal mood, behavior, speech, dress, motor activity, and thought processes Breasts - CMA Carly Warner Mccreedy present for breast and pelvic exam: Breasts appear normal, no suspicious masses, no skin or nipple changes or axillary nodes Pelvic -no external vaginal lesion.  She does moderate amount of yellow malodorous copious discharge in the vaginal vault.  Cervix appears normal.  No cervical motion tenderness no adnexal masses.  Uterus is enlarged.      Latest Ref Rng & Units 04/19/2022   11:17 AM 02/10/2022    6:32 PM 10/10/2021    1:25 PM  CMP  Glucose 70 - 99 mg/dL 91  112  94   BUN 6 - 24 mg/dL 10  9  8    Creatinine 0.57 - 1.00 mg/dL 0.86  0.80  1.00   Sodium 134 - 144 mmol/L 138  136  132   Potassium 3.5 - 5.2 mmol/L 4.4  3.9  4.2   Chloride 96 - 106 mmol/L 105  106  104   CO2 20 -  29 mmol/L 19  24  22    Calcium 8.7 - 10.2 mg/dL 8.8  9.1  8.8   Total Protein 6.5 - 8.1 g/dL  7.9    Total Bilirubin 0.3 - 1.2 mg/dL  0.6    Alkaline Phos 38 - 126 U/L  61    AST 15 - 41 U/L  13    ALT 0 - 44 U/L  12     Lipid Panel  No results found for: "CHOL", "TRIG", "HDL", "CHOLHDL", "VLDL", "LDLCALC", "LDLDIRECT"  CBC    Component Value Date/Time   WBC 6.0 04/19/2022 1117   WBC 7.1 02/10/2022 1832   RBC 4.31 04/19/2022 1117   RBC 4.67 02/10/2022 1832   HGB 11.2 04/19/2022 1117   HCT 35.3 04/19/2022 1117   PLT 270 04/19/2022 1117   MCV 82 04/19/2022 1117   MCH 26.0 (L) 04/19/2022 1117   MCH 26.6 02/10/2022 1832   MCHC 31.7 04/19/2022 1117   MCHC 31.8 02/10/2022 1832   RDW 14.3 04/19/2022 1117   LYMPHSABS 1.9 02/10/2022 1832   MONOABS 0.5 02/10/2022 1832   EOSABS 0.1 02/10/2022 1832   BASOSABS 0.0 02/10/2022 1832    Opioid Risk Tool - 06/02/22 1134       Family History of Substance Abuse   Alcohol Negative    Illegal Drugs Negative    Rx Drugs Negative      Personal History of Substance Abuse   Alcohol Negative    Illegal Drugs Negative    Rx Drugs Negative      Age   Age between 10-45 years  Yes      History of Preadolescent Sexual Abuse   History of Preadolescent Sexual Abuse Negative or Female      Psychological Disease   Psychological Disease Negative    Depression Positive   in her 44s and was on Paxil     Total Score   Opioid Risk Tool Scoring 2    Opioid Risk Interpretation Low Risk             ASSESSMENT AND PLAN: 1. Pap smear for cervical cancer screening - Cytology - PAP - Cervicovaginal ancillary only  2. Dysmenorrhea Given the menorrhagia, dysmenorrhea, anemia and enlarged uterus on exam, I suspect she may have fibroids.  We will order pelvic ultrasound. Ideally we should use ibuprofen but she is on a DOAC.  We discussed using tramadol as needed for menstrual cramps.  Advised that it is a controlled substance and can be  habit-forming.  Advised to use only as needed for cramps. NCCSRS reviewed - traMADol (ULTRAM) 50 MG tablet; Take 1 tablet (50 mg total) by mouth every 8 (eight) hours as needed. To use for menstrual cramps  Dispense: 15 tablet; Refill: 1 - US Pelvic Complete With Transvaginal; Future  3. Menorrhagia with regular cycle See above.  She will continue over-the-counter iron supplement daily - CBC - US Pelvic Complete With Transvaginal; Future  4. Screening for colon cancer We discussed colon cancer screening methods.  She prefers to do colonoscopy - Ambulatory referral to Gastroenterology  5. Normocytic anemia See #3 above  6. Encounter for screening mammogram for malignant neoplasm of breast - MM Digital Screening; Future     Patient was given the opportunity to ask questions.  Patient verbalized understanding of the plan and was able to repeat key elements of the plan.   This documentation was completed using Paediatric nurse.  Any transcriptional errors are unintentional.  Orders Placed This Encounter  Procedures   US Pelvic Complete With Transvaginal   MM Digital Screening   CBC   Ambulatory referral to Gastroenterology     Requested Prescriptions   Signed Prescriptions Disp Refills   traMADol (ULTRAM) 50 MG tablet 15 tablet 1    Sig: Take 1 tablet (50 mg total) by mouth every 8 (eight) hours as needed. To use for menstrual cramps    Return in about 4 months (around 10/03/2022).  Jonah Blue, MD, FACP

## 2022-06-02 NOTE — Patient Instructions (Signed)
I have sent a prescription to your pharmacy for medication called tramadol for you to use as needed for menstrual cramps.  This medication is a narcotic medication.  Use only as needed during your cycles.  You will be called for an appointment for the pelvic ultrasound to check for uterine fibroids.  Further management will be based on results.  I have submitted the referral for your mammogram.  You will be called with an appointment.

## 2022-06-03 LAB — CERVICOVAGINAL ANCILLARY ONLY
Bacterial Vaginitis (gardnerella): POSITIVE — AB
Candida Glabrata: NEGATIVE
Candida Vaginitis: NEGATIVE
Chlamydia: NEGATIVE
Comment: NEGATIVE
Comment: NEGATIVE
Comment: NEGATIVE
Comment: NEGATIVE
Comment: NEGATIVE
Comment: NORMAL
Neisseria Gonorrhea: NEGATIVE
Trichomonas: NEGATIVE

## 2022-06-03 LAB — CBC
Hematocrit: 36.7 % (ref 34.0–46.6)
Hemoglobin: 11.2 g/dL (ref 11.1–15.9)
MCH: 24.6 pg — ABNORMAL LOW (ref 26.6–33.0)
MCHC: 30.5 g/dL — ABNORMAL LOW (ref 31.5–35.7)
MCV: 81 fL (ref 79–97)
Platelets: 317 10*3/uL (ref 150–450)
RBC: 4.55 x10E6/uL (ref 3.77–5.28)
RDW: 14.5 % (ref 11.7–15.4)
WBC: 6.5 10*3/uL (ref 3.4–10.8)

## 2022-06-04 ENCOUNTER — Other Ambulatory Visit: Payer: Self-pay | Admitting: Internal Medicine

## 2022-06-04 MED ORDER — METRONIDAZOLE 500 MG PO TABS
500.0000 mg | ORAL_TABLET | Freq: Two times a day (BID) | ORAL | 0 refills | Status: DC
  Filled 2022-06-04: qty 14, 7d supply, fill #0

## 2022-06-06 ENCOUNTER — Other Ambulatory Visit: Payer: Self-pay

## 2022-06-07 LAB — CYTOLOGY - PAP
Adequacy: ABSENT
Comment: NEGATIVE
Comment: NEGATIVE
Comment: NEGATIVE
Diagnosis: NEGATIVE
HPV 16: NEGATIVE
HPV 18 / 45: NEGATIVE
High risk HPV: POSITIVE — AB

## 2022-06-08 ENCOUNTER — Other Ambulatory Visit: Payer: Self-pay

## 2022-06-21 ENCOUNTER — Other Ambulatory Visit: Payer: Self-pay

## 2022-06-22 ENCOUNTER — Other Ambulatory Visit: Payer: Self-pay

## 2022-07-12 ENCOUNTER — Other Ambulatory Visit: Payer: Self-pay

## 2022-07-13 ENCOUNTER — Other Ambulatory Visit: Payer: Self-pay

## 2022-08-26 ENCOUNTER — Other Ambulatory Visit: Payer: Self-pay

## 2022-09-27 ENCOUNTER — Other Ambulatory Visit: Payer: Self-pay

## 2022-10-03 ENCOUNTER — Ambulatory Visit: Payer: Managed Care, Other (non HMO) | Attending: Internal Medicine | Admitting: Internal Medicine

## 2022-10-03 ENCOUNTER — Encounter: Payer: Self-pay | Admitting: Internal Medicine

## 2022-10-03 ENCOUNTER — Other Ambulatory Visit: Payer: Self-pay

## 2022-10-03 VITALS — BP 118/73 | HR 81 | Temp 98.3°F | Ht 65.0 in | Wt 217.0 lb

## 2022-10-03 DIAGNOSIS — D649 Anemia, unspecified: Secondary | ICD-10-CM

## 2022-10-03 DIAGNOSIS — I2699 Other pulmonary embolism without acute cor pulmonale: Secondary | ICD-10-CM | POA: Diagnosis not present

## 2022-10-03 DIAGNOSIS — N921 Excessive and frequent menstruation with irregular cycle: Secondary | ICD-10-CM | POA: Diagnosis not present

## 2022-10-03 MED ORDER — APIXABAN 5 MG PO TABS
5.0000 mg | ORAL_TABLET | Freq: Two times a day (BID) | ORAL | 5 refills | Status: DC
  Filled 2022-10-03 – 2022-10-24 (×4): qty 60, 30d supply, fill #0
  Filled 2022-11-21: qty 60, 30d supply, fill #1
  Filled 2022-12-22: qty 60, 30d supply, fill #2
  Filled 2023-01-25: qty 60, 30d supply, fill #3
  Filled 2023-03-25: qty 60, 30d supply, fill #4
  Filled 2023-04-25: qty 60, 30d supply, fill #5

## 2022-10-03 NOTE — Progress Notes (Signed)
Patient ID: Carly Garrison, female    DOB: 1976/02/01  MRN: [id number]  CC: Follow-up (4 mo f/u. Med refills. Neoma Laming received flu  vax. )   Subjective: Carly Garrison is a 47 y.o. female who presents for chronic ds management Her concerns today include:  Pt with hx of recurrent PE (12/2020 and 01/2022), obesity,   On last visit patient reported heavy menstrual cycles where she has to wear Depends to work on at bedtime.  I had ordered a pelvic ultrasound as uterus was enlarged on exam.  Patient states she never received an appointment.  Menses continue to be heavy and last 7 days.  For the last 1-1/2 months, she has been having menstrual bleeding every 2 weeks.  She endorses passing clots and has cramps.  She is on Eliquis for history of recurrent blood clots in the lungs.  Besides heavy vaginal bleeding, she denies any rectal bleeding or epistaxis.  No easy bruising.  She takes iron daily from over-the-counter.  Denies any dizziness or fatigue.   HM: She was called by [facility] gastroenterology to be scheduled for her colonoscopy.  She has not called them back as yet but plans to do so. Patient Active Problem List   Diagnosis Date Noted   Other constipation 04/19/2022   Leukocytosis 01/16/2021   Obesity (BMI 30.0-34.9) 01/16/2021   Recurrent pulmonary emboli ([place]) 01/15/2021     Current Outpatient Medications on File Prior to Visit  Medication Sig Dispense Refill   apixaban (ELIQUIS) 5 MG TABS tablet Take 1 tablet (5 mg total) by mouth 2 (two) times daily. 60 tablet 5   traMADol (ULTRAM) 50 MG tablet Take 1 tablet (50 mg total) by mouth every 8 (eight) hours as needed. To use for menstrual cramps 15 tablet 1   metroNIDAZOLE (FLAGYL) 500 MG tablet Take 1 tablet (500 mg total) by mouth 2 (two) times daily. (Patient not taking: Reported on 10/03/2022) 14 tablet 0   Saccharomyces boulardii (PROBIOTIC) 250 MG CAPS Take 250 mg by mouth daily. (Patient not taking: Reported on 06/02/2022)     No  current facility-administered medications on file prior to visit.    No Known Allergies  Social History   Socioeconomic History   Marital status: Married    Spouse name: Not on file   Number of children: Not on file   Years of education: Not on file   Highest education level: Not on file  Occupational History   Not on file  Tobacco Use   Smoking status: Never   Smokeless tobacco: Never  Substance and Sexual Activity   Alcohol use: Not Currently    Comment: wine, occasionally   Drug use: No   Sexual activity: Yes    Birth control/protection: None  Other Topics Concern   Not on file  Social History Narrative   Not on file   Social Determinants of Health   Financial Resource Strain: Not on file  Food Insecurity: Not on file  Transportation Needs: Not on file  Physical Activity: Not on file  Stress: Not on file  Social Connections: Not on file  Intimate Partner Violence: Not on file    Family History  Problem Relation Age of Onset   Clotting disorder Father    Clotting disorder Sister    Breast cancer Neg Hx     No past surgical history on file.  ROS: Review of Systems Negative except as stated above  PHYSICAL EXAM: BP 118/73 (BP Location: Left Arm, Patient  Position: Sitting, Cuff Size: Normal)   Pulse 81   Temp 98.3 F (36.8 C) (Oral)   Ht 5' 5"$  (1.651 m)   Wt 217 lb (98.4 kg)   SpO2 100%   BMI 36.11 kg/m   Physical Exam  General appearance - alert, well appearing, and in no distress Mental status - normal mood, behavior, speech, dress, motor activity, and thought processes Chest - clear to auscultation, no wheezes, rales or rhonchi, symmetric air entry Heart - normal rate, regular rhythm, normal S1, S2, no murmurs, rubs, clicks or gallops Abdomen: Normal bowel sounds.  Uterus is enlarged and felt at the level of the umbilicus. Extremities - peripheral pulses normal, no pedal edema, no clubbing or cyanosis      Latest Ref Rng & Units 04/19/2022    11:17 AM 02/10/2022    6:32 PM 10/10/2021    1:25 PM  CMP  Glucose 70 - 99 mg/dL 91  112  94   BUN 6 - 24 mg/dL 10  9  8   $ Creatinine 0.57 - 1.00 mg/dL 0.86  0.80  1.00   Sodium 134 - 144 mmol/L 138  136  132   Potassium 3.5 - 5.2 mmol/L 4.4  3.9  4.2   Chloride 96 - 106 mmol/L 105  106  104   CO2 20 - 29 mmol/L 19  24  22   $ Calcium 8.7 - 10.2 mg/dL 8.8  9.1  8.8   Total Protein 6.5 - 8.1 g/dL  7.9    Total Bilirubin 0.3 - 1.2 mg/dL  0.6    Alkaline Phos 38 - 126 U/L  61    AST 15 - 41 U/L  13    ALT 0 - 44 U/L  12     Lipid Panel  No results found for: "CHOL", "TRIG", "HDL", "CHOLHDL", "VLDL", "LDLCALC", "LDLDIRECT"  CBC    Component Value Date/Time   WBC 6.5 06/02/2022 1231   WBC 7.1 02/10/2022 1832   RBC 4.55 06/02/2022 1231   RBC 4.67 02/10/2022 1832   HGB 11.2 06/02/2022 1231   HCT 36.7 06/02/2022 1231   PLT 317 06/02/2022 1231   MCV 81 06/02/2022 1231   MCH 24.6 (L) 06/02/2022 1231   MCH 26.6 02/10/2022 1832   MCHC 30.5 (L) 06/02/2022 1231   MCHC 31.8 02/10/2022 1832   RDW 14.5 06/02/2022 1231   LYMPHSABS 1.9 02/10/2022 1832   MONOABS 0.5 02/10/2022 1832   EOSABS 0.1 02/10/2022 1832   BASOSABS 0.0 02/10/2022 1832    ASSESSMENT AND PLAN: 1. Menorrhagia with irregular cycle I had my CMA call today to schedule her pelvic ultrasound.  We will refer her to gynecology as well.  Advised that if she does not receive a call with an appointment within 2 to 3 weeks, she should call us back to let us know. - CBC; Future - Iron, TIBC and Ferritin Panel; Future - US Pelvic Complete With Transvaginal; Future - Ambulatory referral to Gynecology  2. Recurrent pulmonary emboli (HCC) Continue Eliquis.  Advised to report any rectal bleeding or epistaxis or excessive bruising. - apixaban (ELIQUIS) 5 MG TABS tablet; Take 1 tablet (5 mg total) by mouth 2 (two) times daily.  Dispense: 60 tablet; Refill: 5  3. Normocytic anemia - Ambulatory referral to Gynecology  Patient given  phone number for [facility] gastroenterology.  Advised to call them to schedule her colonoscopy.  Patient was given the opportunity to ask questions.  Patient verbalized understanding of the plan and  was able to repeat key elements of the plan.   This documentation was completed using Radio producer.  Any transcriptional errors are unintentional.  No orders of the defined types were placed in this encounter.    Requested Prescriptions    No prescriptions requested or ordered in this encounter    No follow-ups on file.  Karle Plumber, MD, FACP

## 2022-10-03 NOTE — Patient Instructions (Addendum)
Please call [facility] to schedule your colonoscopy.  PH# [phone].  I have referred you to gynecology.  If you do not receive a call within 2-3 weeks please call us back to let us know.  Please return to the lab this week to have blood test done as ordered.

## 2022-10-07 ENCOUNTER — Ambulatory Visit: Payer: Managed Care, Other (non HMO) | Attending: Internal Medicine

## 2022-10-07 DIAGNOSIS — N921 Excessive and frequent menstruation with irregular cycle: Secondary | ICD-10-CM

## 2022-10-08 ENCOUNTER — Telehealth: Payer: Self-pay | Admitting: Internal Medicine

## 2022-10-08 ENCOUNTER — Other Ambulatory Visit: Payer: Self-pay | Admitting: Internal Medicine

## 2022-10-08 DIAGNOSIS — D509 Iron deficiency anemia, unspecified: Secondary | ICD-10-CM | POA: Insufficient documentation

## 2022-10-08 DIAGNOSIS — D500 Iron deficiency anemia secondary to blood loss (chronic): Secondary | ICD-10-CM

## 2022-10-08 LAB — CBC
Hematocrit: 30.5 % — ABNORMAL LOW (ref 34.0–46.6)
Hemoglobin: 8.8 g/dL — ABNORMAL LOW (ref 11.1–15.9)
MCH: 20.5 pg — ABNORMAL LOW (ref 26.6–33.0)
MCHC: 28.9 g/dL — ABNORMAL LOW (ref 31.5–35.7)
MCV: 71 fL — ABNORMAL LOW (ref 79–97)
Platelets: 324 10*3/uL (ref 150–450)
RBC: 4.3 x10E6/uL (ref 3.77–5.28)
RDW: 17.1 % — ABNORMAL HIGH (ref 11.7–15.4)
WBC: 8.5 10*3/uL (ref 3.4–10.8)

## 2022-10-08 LAB — IRON,TIBC AND FERRITIN PANEL
Ferritin: 10 ng/mL — ABNORMAL LOW (ref 15–150)
Iron Saturation: 6 % — CL (ref 15–55)
Iron: 21 ug/dL — ABNORMAL LOW (ref 27–159)
Total Iron Binding Capacity: 375 ug/dL (ref 250–450)
UIBC: 354 ug/dL (ref 131–425)

## 2022-10-08 MED ORDER — FERROUS SULFATE 325 (65 FE) MG PO TABS
325.0000 mg | ORAL_TABLET | Freq: Every day | ORAL | 1 refills | Status: DC
  Filled 2022-10-08 – 2022-10-24 (×2): qty 100, 100d supply, fill #0
  Filled 2023-01-16: qty 100, 100d supply, fill #1

## 2022-10-08 NOTE — Telephone Encounter (Signed)
PC placed to patient this evening to go over the results of her blood test.  Patient informed that she has developed a significant anemia since last blood test that was done in October 2023.  This is due to heavy vaginal bleeding that she has been having on Eliquis.  I recommend that she starts taking iron supplement daily.  I have sent the prescription to our pharmacy.  If it is not covered by her insurance, she can purchase it over-the-counter and take 1 tablet daily.  I have sent the name and dosage to her via [place].  Advised that iron is better absorbed in an acidic environment so I recommend taking it with a few sips of orange juice.  I informed patient that I have also sent a message to our referral coordinator requesting that she tries to get her in with gynecology as soon as possible.  Patient expressed understanding. Advised that I would like for her to return to the lab in 1 month to have CBC rechecked to make sure that her blood count is improving.  She is agreeable to doing so.

## 2022-10-10 ENCOUNTER — Other Ambulatory Visit: Payer: Self-pay

## 2022-10-10 ENCOUNTER — Ambulatory Visit
Admission: RE | Admit: 2022-10-10 | Discharge: 2022-10-10 | Disposition: A | Discharge status: Home / Self Care | Payer: Managed Care, Other (non HMO) | Source: Ambulatory Visit | Attending: Internal Medicine | Admitting: Internal Medicine

## 2022-10-10 DIAGNOSIS — N921 Excessive and frequent menstruation with irregular cycle: Secondary | ICD-10-CM | POA: Insufficient documentation

## 2022-10-11 IMAGING — CT CT ANGIO CHEST
2 of 8 series · 18 of 36 positions shown · IV contrast (agent unspecified)
Comparison: Chest radiograph dated 02/10/2022 and CT dated
10/10/2021.

CLINICAL DATA: Chest pain.  Concern for pulmonary embolism.

EXAM:
CT ANGIOGRAPHY CHEST WITH CONTRAST
TECHNIQUE: Multidetector CT imaging of the chest was performed using the
standard protocol during bolus administration of intravenous
contrast. Multiplanar CT image reconstructions and MIPs were
obtained to evaluate the vascular anatomy.

[Series 6: pe thins · axial · 0.73mm/px · z∈[+877,+1082]mm · 17 of 231 slices shown]
[im 13/231  lung]
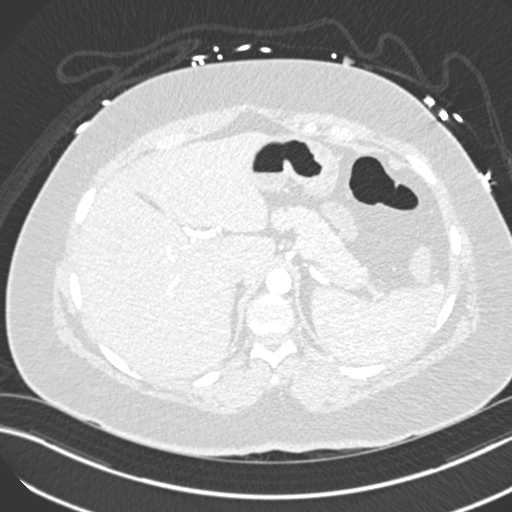
[im 25/231  mediastinal]
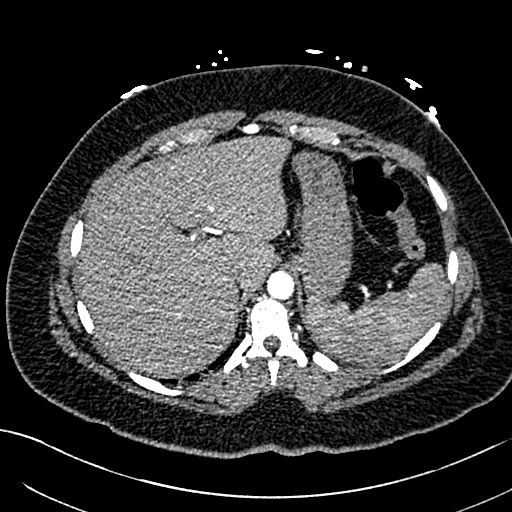
[im 37/231  lung]
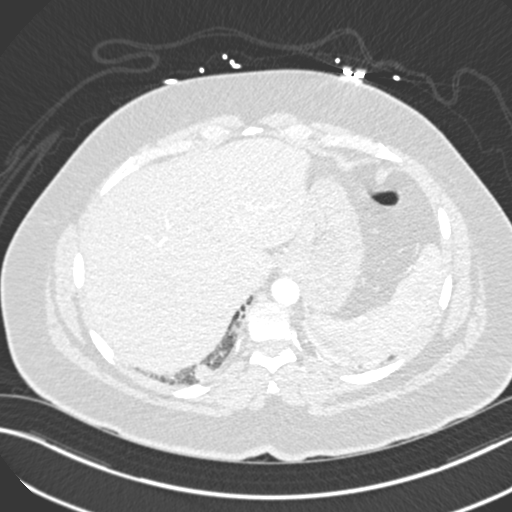
[im 49/231  mediastinal]
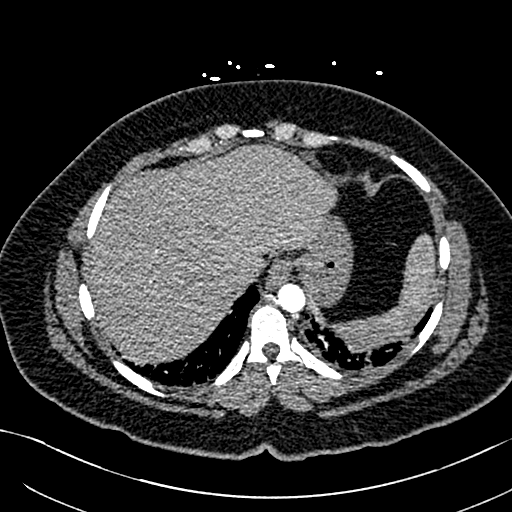
[im 61/231  lung]
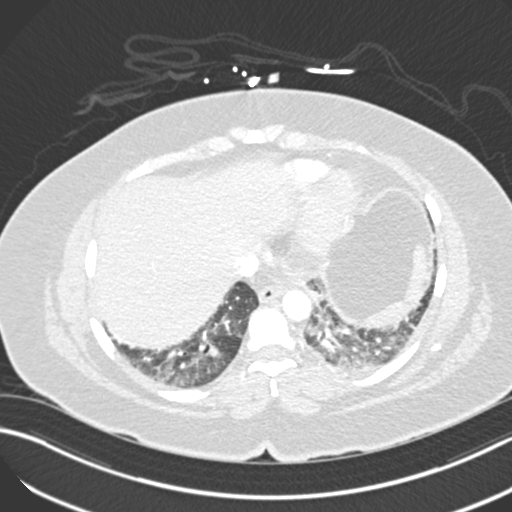
[im 73/231  mediastinal]
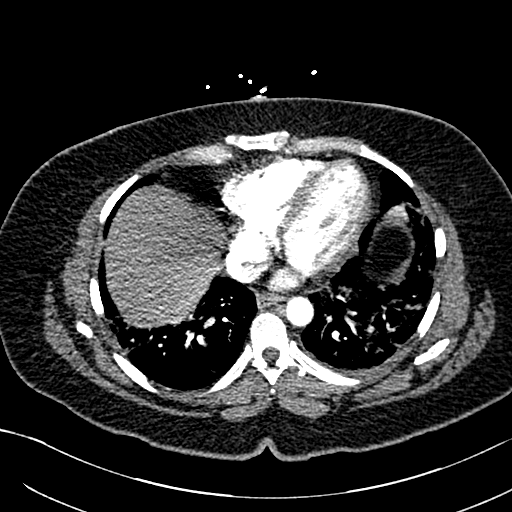
[im 85/231  lung]
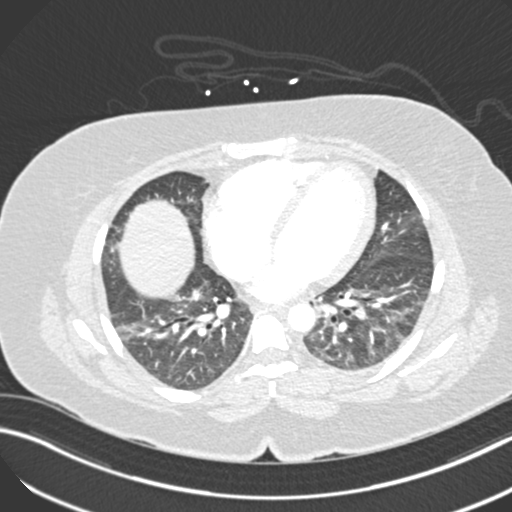
[im 97/231  mediastinal]
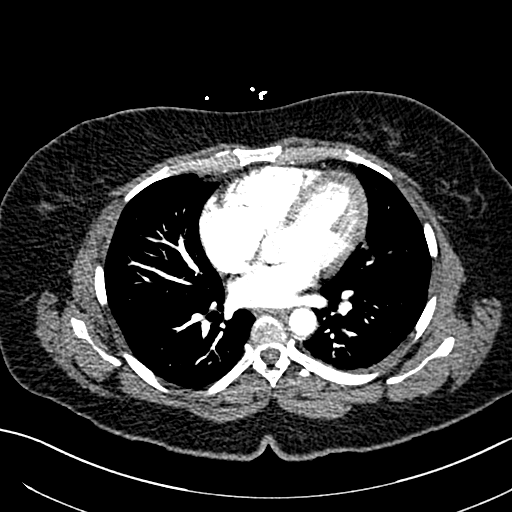
[im 122/231  lung]
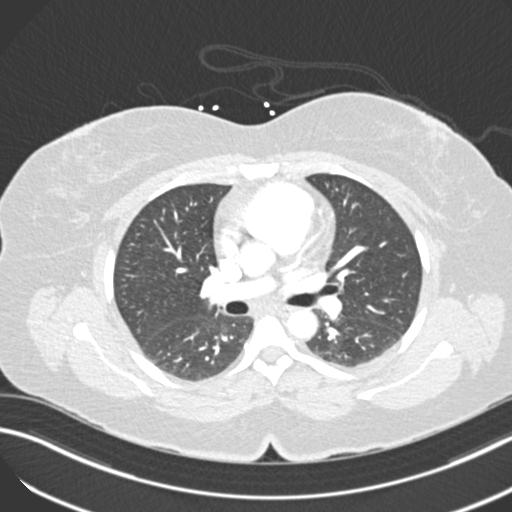
[im 134/231  mediastinal]
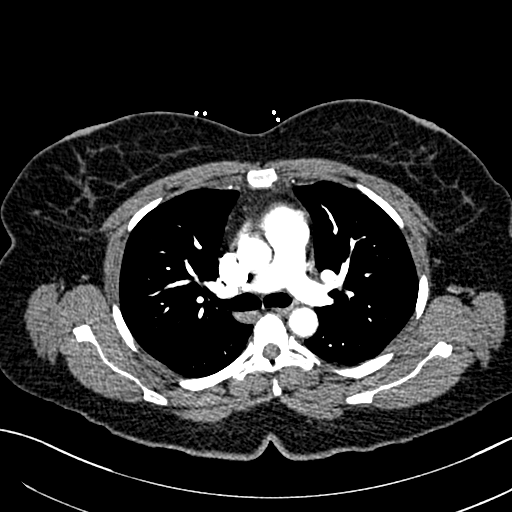
[im 146/231  lung]
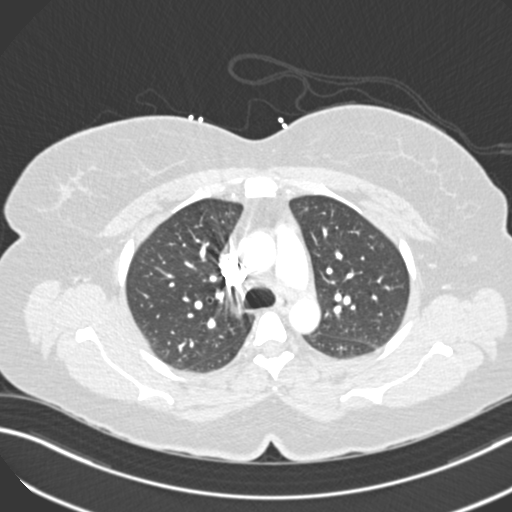
[im 158/231  mediastinal]
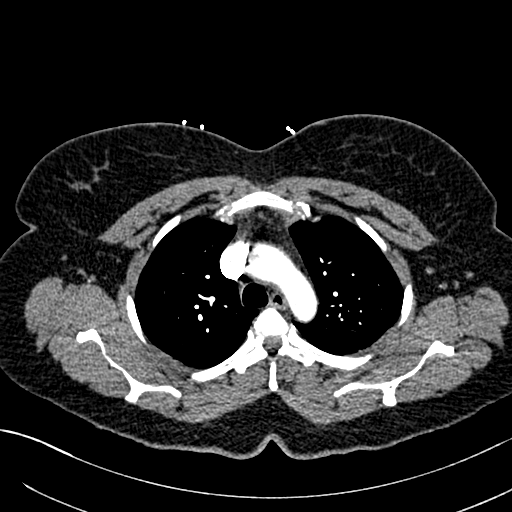
[im 170/231  lung]
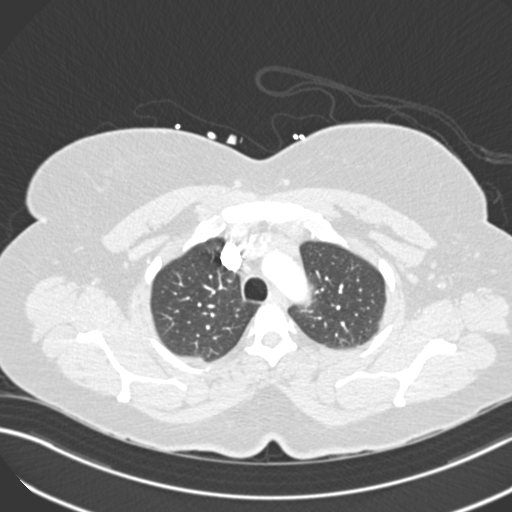
[im 182/231  mediastinal]
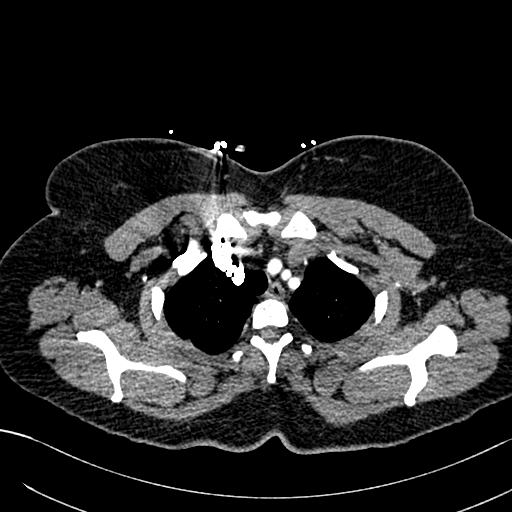
[im 194/231  lung]
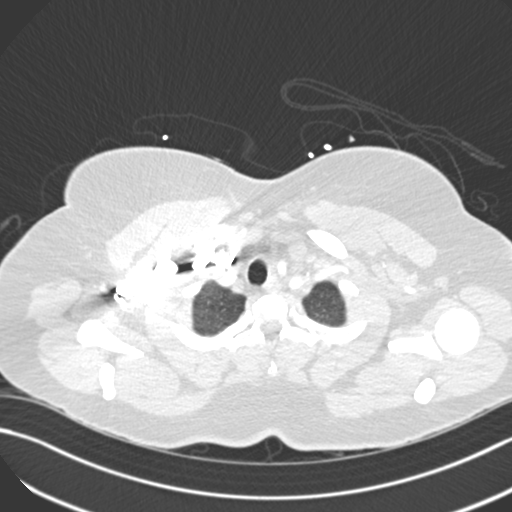
[im 206/231  mediastinal]
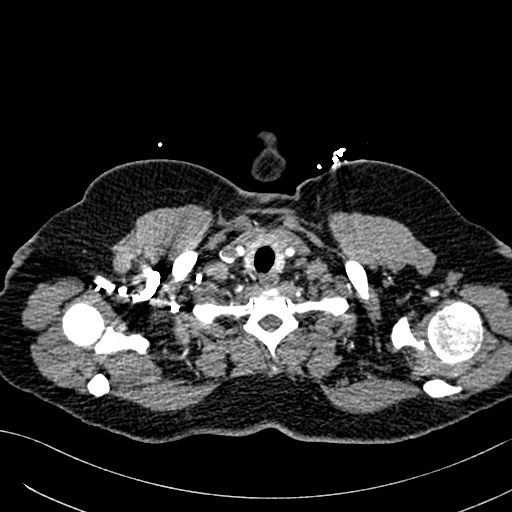
[im 218/231  lung]
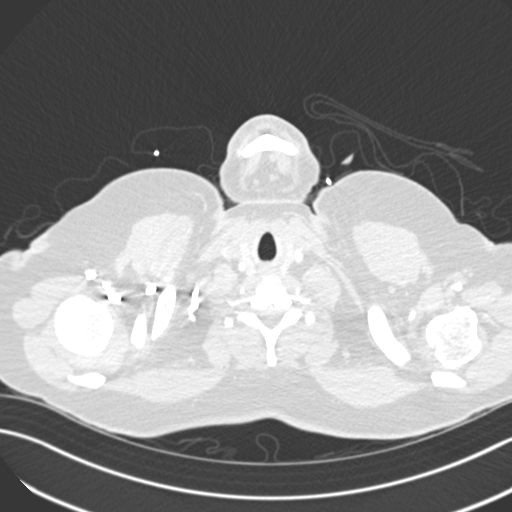

[Series 7: pe coronal mpr · coronal · 0.59mm/px · 1 of 151 slices shown]
[im 76/151  mediastinal]
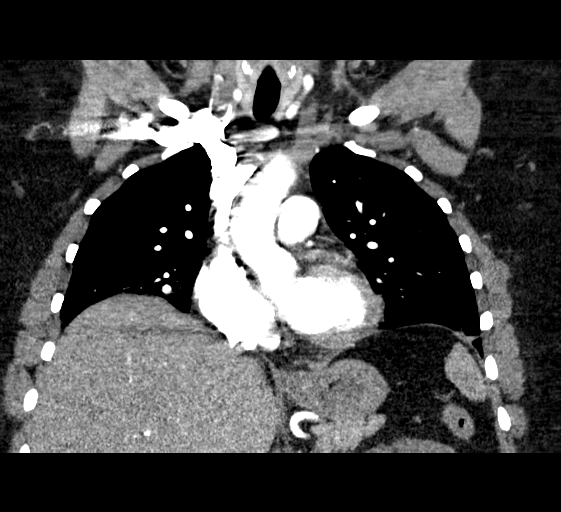

[18 of 36 positions shown; findings below may reference images not displayed]

RADIATION DOSE REDUCTION: This exam was performed according to the
departmental dose-optimization program which includes automated
exposure control, adjustment of the mA and/or kV according to
patient size and/or use of iterative reconstruction technique.

CONTRAST:  80mL OMNIPAQUE IOHEXOL 350 MG/ML SOLN
FINDINGS: Cardiovascular: There is no cardiomegaly or pericardial effusion.
The thoracic aorta is unremarkable. The origins of the great vessels
of the aortic arch appear patent. Bilateral lower lobe segmental and
subsegmental pulmonary artery emboli noted. No CT evidence of right
heart straining.

Mediastinum/Nodes: No hilar or mediastinal adenopathy. The esophagus
and the thyroid gland are grossly unremarkable. No mediastinal fluid
collection.

Lungs/Pleura: There are bibasilar linear and platelike streaky
atelectasis or scarring. Indeterminate 1 cm right lung base
subpleural nodule (65/5), possibly post inflammatory and sequela of
prior inflammatory process or developing scar. No lobar
consolidation, or pneumothorax. The central airways are patent.

Upper Abdomen: No acute abnormality.

Musculoskeletal: No chest wall abnormality. No acute or significant
osseous findings.

Review of the MIP images confirms the above findings.
IMPRESSION: Bilateral lower lobe segmental and subsegmental pulmonary artery
emboli. No CT evidence of right heart straining.

These results were called by telephone at the time of interpretation
on 02/10/2022 at [DATE] to provider WILLEM MENA , who
verbally acknowledged these results.

## 2022-10-12 ENCOUNTER — Telehealth: Payer: Self-pay | Admitting: Internal Medicine

## 2022-10-12 NOTE — Telephone Encounter (Signed)
-----   Message from Ena Dawley sent at 10/12/2022  1:29 PM EST ----- Regarding: Gyn  Referral Today  @  10am   GCG/Left message for patient to call and schedule appointment.  ----- Message ----- From: Ladell Pier, MD Sent: 10/08/2022   5:40 PM EST To: Ena Dawley  Please get her in with gynecology as soon as possible.

## 2022-10-17 ENCOUNTER — Other Ambulatory Visit: Payer: Self-pay

## 2022-10-21 ENCOUNTER — Other Ambulatory Visit: Payer: Self-pay

## 2022-10-24 ENCOUNTER — Other Ambulatory Visit: Payer: Self-pay

## 2022-11-21 ENCOUNTER — Other Ambulatory Visit: Payer: Self-pay

## 2022-11-29 ENCOUNTER — Encounter: Payer: Self-pay | Admitting: Radiology

## 2022-11-29 ENCOUNTER — Ambulatory Visit (INDEPENDENT_AMBULATORY_CARE_PROVIDER_SITE_OTHER): Payer: Managed Care, Other (non HMO) | Admitting: Radiology

## 2022-11-29 VITALS — BP 110/80 | Ht 65.0 in | Wt 214.0 lb

## 2022-11-29 DIAGNOSIS — Z30011 Encounter for initial prescription of contraceptive pills: Secondary | ICD-10-CM

## 2022-11-29 DIAGNOSIS — N920 Excessive and frequent menstruation with regular cycle: Secondary | ICD-10-CM | POA: Diagnosis not present

## 2022-11-29 DIAGNOSIS — D500 Iron deficiency anemia secondary to blood loss (chronic): Secondary | ICD-10-CM

## 2022-11-29 DIAGNOSIS — D219 Benign neoplasm of connective and other soft tissue, unspecified: Secondary | ICD-10-CM

## 2022-11-29 MED ORDER — SLYND 4 MG PO TABS: 1.0000 | ORAL_TABLET | Freq: Every day | ORAL | 2 refills | Status: DC

## 2022-11-29 NOTE — Progress Notes (Signed)
Carly Garrison 1975/10/11 [id number]   History:  47 y.o. G4P3 referred for menorrhagia with irregular periods. Complains of periods 2 times a month, lasting 7 days each time. Taking blood thinners since 02/2022 for PE. Multiple fibroids on u/s 09/2022. Anemia being treated with ferrous sulfate daily, has f/u appt for labs in 2 weeks.   Gynecologic History Patient's last menstrual period was 11/11/2022 (exact date). Period Pattern: (!) Irregular Menstrual Flow: Heavy Menstrual Control:  (depends) Dysmenorrhea: (!) Severe Dysmenorrhea Symptoms: Cramping Contraception/Family planning: none Sexually active: yes Last Pap: 06/02/22. Results were: normal pap HPV+, repeat 1 year   Obstetric History OB History  Gravida Para Term Preterm AB Living  4 3       4   SAB IAB Ectopic Multiple Live Births               # Outcome Date GA Lbr Len/2nd Weight Sex Delivery Anes PTL Lv  4 Gravida           3 Para           2 Para           1 Para              The following portions of the patient's history were reviewed and updated as appropriate: allergies, current medications, past family history, past medical history, past social history, past surgical history, and problem list.  Review of Systems Pertinent items noted in HPI and remainder of comprehensive ROS otherwise negative.   Past medical history, past surgical history, family history and social history were all reviewed and documented in the EPIC chart.  Narrative & Impression  CLINICAL DATA:  Menorrhagia with irregular cycle, abnormal uterine bleeding for 6 months, anemia, LMP 09/26/2022   EXAM: TRANSABDOMINAL AND TRANSVAGINAL ULTRASOUND OF PELVIS   TECHNIQUE: Both transabdominal and transvaginal ultrasound examinations of the pelvis were performed. Transabdominal technique was performed for global imaging of the pelvis including uterus, ovaries, adnexal regions, and pelvic cul-de-sac. It was necessary to proceed  with endovaginal exam following the transabdominal exam to visualize the endometrium and ovaries.   COMPARISON:  None Available.   FINDINGS: Uterus   Measurements: 15.3 x 11.1 x 11.8 cm = volume: 1044 mL. Anteverted. Enlarged and heterogeneous containing multiple nodular foci likely representing multiple leiomyomata. Some of these are ill-defined. Largest measurable lesions include a 9.4 cm potentially submucosal leiomyoma at upper uterine segment and a 5.7 cm leiomyoma subserosal at posterior uterus. Multiple nabothian cysts.   Endometrium   Thickness: Obscured by leiomyomata.  Unable to visualize.   Right ovary   Not visualized, likely obscured by bowel and enlarged uterus   Left ovary   Not visualized, likely obscured by bowel and enlarged uterus   Other findings   No free pelvic fluid or adnexal masses.   IMPRESSION: Enlarged uterus containing multiple leiomyomata, largest 9.4 cm diameter potentially submucosal at upper uterine segment though the endometrial complex is inadequately visualized.   Nonvisualization of endometrial complex and ovaries.     Electronically Signed   By: Ulyses Southward M.D.   On: 10/10/2022 17:51      Exam:  Vitals:   11/29/22 1052  BP: 110/80  Weight: 214 lb (97.1 kg)  Height: 5\' 5"  (1.651 m)   Body mass index is 35.61 kg/m.  General appearance:  Normal Respiratory  Auscultation:  Clear without wheezing or rhonchi Cardiovascular  Auscultation:  Regular rate, without rubs, murmurs or gallops  Edema/varicosities:  Not grossly evident  Abdominal  Soft,nontender, without masses, guarding or rebound.  Liver/spleen:  No organomegaly noted  Hernia:  None appreciated  Skin  Inspection:  Grossly normal Genitourinary   Inguinal/mons:  Normal without inguinal adenopathy  External genitalia:  Normal appearing vulva with no masses, tenderness, or lesions  BUS/Urethra/Skene's glands:  Normal without masses or exudate  Vagina:   Normal appearing with normal color and discharge, no lesions  Cervix:  Normal appearing without discharge or lesions  Uterus:  Normal in size, shape and contour.  Mobile, nontender  Adnexa/parametria:     Rt: Normal in size, without masses or tenderness.   Lt: Normal in size, without masses or tenderness.  Anus and perineum: Normal   Raynelle Fanning, CMA present for exam  Assessment/Plan:   1. Menorrhagia with regular cycle - Drospirenone (SLYND) 4 MG TABS; Take 1 tablet (4 mg total) by mouth daily.  Dispense: 28 tablet; Refill: 2  2. Fibroids  3. Iron deficiency anemia due to chronic blood loss  4. OCP (oral contraceptive pills) initiation - Drospirenone (SLYND) 4 MG TABS; Take 1 tablet (4 mg total) by mouth daily.  Dispense: 28 tablet; Refill: 2    Follow up 3 months Dolan Xia B WHNP-BC 11:25 AM 11/29/2022

## 2022-12-21 ENCOUNTER — Ambulatory Visit
Admission: EM | Admit: 2022-12-21 | Discharge: 2022-12-21 | Disposition: A | Discharge status: Home / Self Care | Payer: Managed Care, Other (non HMO) | Attending: Nurse Practitioner | Admitting: Nurse Practitioner

## 2022-12-21 DIAGNOSIS — S0501XA Injury of conjunctiva and corneal abrasion without foreign body, right eye, initial encounter: Secondary | ICD-10-CM

## 2022-12-21 MED ORDER — ERYTHROMYCIN 5 MG/GM OP OINT: TOPICAL_OINTMENT | Freq: Four times a day (QID) | OPHTHALMIC | 0 refills | Status: AC

## 2022-12-21 NOTE — ED Triage Notes (Addendum)
Pt c/o right eye, swelling, pt had lashes put on Saturday morning and right eye started swelling the following Monday after tech placed lashes. pt has used "clear eye" eye drops hurt to use the drops

## 2022-12-21 NOTE — ED Provider Notes (Signed)
[facility]    CSN: [id number] Arrival date & time: 12/21/22  1208      History   Chief Complaint No chief complaint on file.   HPI Carly Garrison is a 47 y.o. female.   Patient presents today for 3 day history of right eye swelling, pain, and decreased vision.  She reports over the weekend, she and her daughters had professional pictures taken and she had false eyelashes placed reports the irritation began shortly after the eyelashes replaced.  She endorses eye pain and difficulty keeping her eye open because of the pain.  Also endorses foreign body sensation.  No discharge, crusting/matting of the eyelids, swelling around the eye, headache, floaters in vision, or contact lens use.  Patient does endorse photophobia and tearing.  Has tried over-the-counter pinkeye eyedrops without much improvement.  Has also taken Tylenol for the pain without improvement.    Past Medical History:  Diagnosis Date   Anemia    Pulmonary embolism [facility])     Patient Active Problem List   Diagnosis Date Noted   Iron deficiency anemia 10/08/2022   Other constipation 04/19/2022   Leukocytosis 01/16/2021   Obesity (BMI 30.0-34.9) 01/16/2021   Recurrent pulmonary emboli (HCC) 01/15/2021    History reviewed. No pertinent surgical history.  OB History     Gravida  4   Para  3   Term      Preterm      AB      Living  4      SAB      IAB      Ectopic      Multiple      Live Births               Home Medications    Prior to Admission medications   Medication Sig Start Date End Date Taking? Authorizing Provider  erythromycin ophthalmic ointment Place into the right eye 4 (four) times daily for 7 days. Place a 1/2 inch ribbon of ointment into the lower eyelid. 12/21/22 12/28/22 Yes Valentino Nose, NP  apixaban (ELIQUIS) 5 MG TABS tablet Take 1 tablet (5 mg total) by mouth 2 (two) times daily. 10/03/22   Marcine Matar, MD  Drospirenone (SLYND) 4 MG TABS Take 1  tablet (4 mg total) by mouth daily. 11/29/22   Chrzanowski, Clearnce Hasten B, NP  ferrous sulfate 325 (65 FE) MG tablet Take 1 tablet (325 mg total) by mouth daily with breakfast. 10/08/22   Marcine Matar, MD  traMADol (ULTRAM) 50 MG tablet Take 1 tablet (50 mg total) by mouth every 8 (eight) hours as needed. To use for menstrual cramps 06/02/22   Marcine Matar, MD    Family History Family History  Problem Relation Age of Onset   Clotting disorder Father    Clotting disorder Sister    Breast cancer Neg Hx     Social History Social History   Tobacco Use   Smoking status: Never    Passive exposure: Never   Smokeless tobacco: Never  Substance Use Topics   Alcohol use: Not Currently    Comment: wine, occasionally   Drug use: No     Allergies   Patient has no known allergies.   Review of Systems Review of Systems Per HPI  Physical Exam Triage Vital Signs ED Triage Vitals  Enc Vitals Group     BP 12/21/22 1216 (!) 146/80     Pulse Rate 12/21/22 1216 65  Resp 12/21/22 1216 20     Temp 12/21/22 1216 98.2 F (36.8 C)     Temp Source 12/21/22 1216 Oral     SpO2 12/21/22 1216 98 %     Weight --      Height --      Head Circumference --      Peak Flow --      Pain Score 12/21/22 1220 10     Pain Loc --      Pain Edu? --      Excl. in GC? --    No data found.  Updated Vital Signs BP (!) 146/80 (BP Location: Right Arm)   Pulse 65   Temp 98.2 F (36.8 C) (Oral)   Resp 20   LMP 11/11/2022 (Exact Date)   SpO2 98%   Visual Acuity Right Eye Distance: 20/25 Left Eye Distance: 20/20 Bilateral Distance: 20/20  Right Eye Near:   Left Eye Near:    Bilateral Near:     Physical Exam Vitals and nursing note reviewed.  Constitutional:      General: She is not in acute distress.    Appearance: Normal appearance. She is not toxic-appearing.  HENT:     Head: Normocephalic and atraumatic.     Right Ear: External ear normal.     Left Ear: External ear normal.      Mouth/Throat:     Mouth: Mucous membranes are moist.     Pharynx: Oropharynx is clear. No oropharyngeal exudate or posterior oropharyngeal erythema.  Eyes:     General: No scleral icterus.       Right eye: No discharge.        Left eye: No discharge.     Extraocular Movements: Extraocular movements intact.     Conjunctiva/sclera: Conjunctivae normal.     Pupils: Pupils are equal, round, and reactive to light.     Right eye: Fluorescein uptake present.  Pulmonary:     Effort: Pulmonary effort is normal. No respiratory distress.  Skin:    General: Skin is warm and dry.     Capillary Refill: Capillary refill takes less than 2 seconds.     Coloration: Skin is not jaundiced or pale.     Findings: No erythema.  Neurological:     Mental Status: She is alert and oriented to person, place, and time.  Psychiatric:        Behavior: Behavior is cooperative.      UC Treatments / Results  Labs (all labs ordered are listed, but only abnormal results are displayed) Labs Reviewed - No data to display  EKG   Radiology No results found.  Procedures Procedures (including critical care time)  Medications Ordered in UC Medications - No data to display  Initial Impression / Assessment and Plan / UC Course  I have reviewed the triage vital signs and the nursing notes.  Pertinent labs & imaging results that were available during my care of the patient were reviewed by me and considered in my medical decision making (see chart for details).   Patient is well-appearing, normotensive, afebrile, not tachycardic, not tachypneic, oxygenating well on room air.    1. Abrasion of right cornea, initial encounter Fluorescein uptake of inferior cornea of right eye Treat with erythromycin ointment 4 times a day for 7 days Strict ER and return precautions discussed Recommended follow-up with eye doctor if no improvement or worsening symptoms despite treatment  The patient was given the opportunity  to ask questions.  All questions answered to their satisfaction.  The patient is in agreement to this plan.   Final Clinical Impressions(s) / UC Diagnoses   Final diagnoses:  Abrasion of right cornea, initial encounter     Discharge Instructions      The fluorescein stain today showed a corneal abrasion on your right eye.  Please use the erythromycin ointment to help this to heal and help with inflammation.  You can take an oral antihistamine to help with itching as well as Tylenol/ibuprofen as needed for eye pain.  Follow-up with an eye doctor if your symptoms persist or worsen despite treatment.     ED Prescriptions     Medication Sig Dispense Auth. Provider   erythromycin ophthalmic ointment Place into the right eye 4 (four) times daily for 7 days. Place a 1/2 inch ribbon of ointment into the lower eyelid. 3.5 g Valentino Nose, NP      PDMP not reviewed this encounter.   Valentino Nose, NP 12/21/22 1318

## 2022-12-21 NOTE — Discharge Instructions (Signed)
The fluorescein stain today showed a corneal abrasion on your right eye.  Please use the erythromycin ointment to help this to heal and help with inflammation.  You can take an oral antihistamine to help with itching as well as Tylenol/ibuprofen as needed for eye pain.  Follow-up with an eye doctor if your symptoms persist or worsen despite treatment.

## 2022-12-23 ENCOUNTER — Other Ambulatory Visit: Payer: Self-pay

## 2023-01-18 ENCOUNTER — Other Ambulatory Visit: Payer: Self-pay

## 2023-02-01 ENCOUNTER — Encounter: Payer: Self-pay | Admitting: Internal Medicine

## 2023-02-01 ENCOUNTER — Ambulatory Visit: Payer: Managed Care, Other (non HMO) | Attending: Internal Medicine | Admitting: Internal Medicine

## 2023-02-01 VITALS — BP 119/78 | HR 73 | Temp 98.2°F | Ht 65.0 in | Wt 212.0 lb

## 2023-02-01 DIAGNOSIS — Z1211 Encounter for screening for malignant neoplasm of colon: Secondary | ICD-10-CM

## 2023-02-01 DIAGNOSIS — N921 Excessive and frequent menstruation with irregular cycle: Secondary | ICD-10-CM

## 2023-02-01 DIAGNOSIS — D500 Iron deficiency anemia secondary to blood loss (chronic): Secondary | ICD-10-CM

## 2023-02-01 NOTE — Progress Notes (Signed)
Patient ID: Carly Garrison, female    DOB: October 24, 1975  MRN: [id number]  CC: Follow-up (Follow up./Reports still having menstruation cycle every 2 weeks, pain in L side /Yes to colonoscopy referral. )   Subjective: Carly Garrison is a 47 y.o. female who presents for chronic ds management Her concerns today include:  Pt with hx of recurrent PE (12/2020 and 01/2022), obesity,     Visit 10/03/2022:  On last visit patient reported heavy menstrual cycles where she has to wear Depends to work on at bedtime.  I had ordered a pelvic ultrasound as uterus was enlarged on exam.  Patient states she never received an appointment.  Menses continue to be heavy and last 7 days.  For the last 1-1/2 months, she has been having menstrual bleeding every 2 weeks.  She endorses passing clots and has cramps.  She is on Eliquis for history of recurrent blood clots in the lungs.  Besides heavy vaginal bleeding, she denies any rectal bleeding or epistaxis.  No easy bruising.  She takes iron daily from over-the-counter.  Denies any dizziness or fatigue.  I had ordered a pelvic ultrasound and referred her to gynecology.  Pelvic ultrasound confirmed uterine fibroids.  She saw gynecology in April.  She was placed on Drospirenone.  She states this has not slowed down the menstrual bleeding.  She still has a cycle every 2 weeks that last for 1 week with heavy bleeding.  She tells me that she had sent the gynecologist a message but has not heard back.  She is still taking the iron supplement 1 pill daily.  Denies any dizziness.  HM: She was given information for [facility] gastroenterology on last visit and told to call them to schedule the colonoscopy as they had been trying to reach her.  She misplaced the information.   Patient Active Problem List   Diagnosis Date Noted   Iron deficiency anemia 10/08/2022   Other constipation 04/19/2022   Leukocytosis 01/16/2021   Obesity (BMI 30.0-34.9) 01/16/2021   Recurrent pulmonary emboli  (HCC) 01/15/2021     Current Outpatient Medications on File Prior to Visit  Medication Sig Dispense Refill   apixaban (ELIQUIS) 5 MG TABS tablet Take 1 tablet (5 mg total) by mouth 2 (two) times daily. 60 tablet 5   Drospirenone (SLYND) 4 MG TABS Take 1 tablet (4 mg total) by mouth daily. 28 tablet 2   ferrous sulfate 325 (65 FE) MG tablet Take 1 tablet (325 mg total) by mouth daily with breakfast. 100 tablet 1   traMADol (ULTRAM) 50 MG tablet Take 1 tablet (50 mg total) by mouth every 8 (eight) hours as needed. To use for menstrual cramps 15 tablet 1   No current facility-administered medications on file prior to visit.    No Known Allergies  Social History   Socioeconomic History   Marital status: Married    Spouse name: Not on file   Number of children: Not on file   Years of education: Not on file   Highest education level: 12th grade  Occupational History   Not on file  Tobacco Use   Smoking status: Never    Passive exposure: Never   Smokeless tobacco: Never  Substance and Sexual Activity   Alcohol use: Not Currently    Comment: wine, occasionally   Drug use: No   Sexual activity: Yes    Partners: Male    Birth control/protection: None, Condom, Surgical    Comment: tubal, menarche 47yo, sexual  debut 47yo  Other Topics Concern   Not on file  Social History Narrative   Not on file   Social Determinants of Health   Financial Resource Strain: Low Risk  (01/28/2023)   Overall Financial Resource Strain (CARDIA)    Difficulty of Paying Living Expenses: Not hard at all  Food Insecurity: Food Insecurity Present (01/28/2023)   Hunger Vital Sign    Worried About Running Out of Food in the Last Year: Never true    Ran Out of Food in the Last Year: Sometimes true  Transportation Needs: No Transportation Needs (01/28/2023)   PRAPARE - Administrator, Civil Service (Medical): No    Lack of Transportation (Non-Medical): No  Physical Activity: Sufficiently Active  (01/28/2023)   Exercise Vital Sign    Days of Exercise per Week: 7 days    Minutes of Exercise per Session: 30 min  Stress: Stress Concern Present (01/28/2023)   [employer] of Occupational Health - Occupational Stress Questionnaire    Feeling of Stress : To some extent  Social Connections: Unknown (01/28/2023)   Social Connection and Isolation Panel [NHANES]    Frequency of Communication with Friends and Family: More than three times a week    Frequency of Social Gatherings with Friends and Family: Once a week    Attends Religious Services: More than 4 times per year    Active Member of [employer] or Organizations: Patient declined    Attends Engineer, structural: Not on file    Marital Status: Separated  Intimate Partner Violence: Not on file    Family History  Problem Relation Age of Onset   Clotting disorder Father    Clotting disorder Sister    Breast cancer Neg Hx     No past surgical history on file.  ROS: Review of Systems Negative except as stated above  PHYSICAL EXAM: BP 119/78 (BP Location: Left Arm, Patient Position: Sitting, Cuff Size: Normal)   Pulse 73   Temp 98.2 F (36.8 C) (Oral)   Ht 5\' 5"  (1.651 m)   Wt 212 lb (96.2 kg)   SpO2 100%   BMI 35.28 kg/m   Physical Exam  General appearance - alert, well appearing, middle-age African-American female and in no distress Mental status - normal mood, behavior, speech, dress, motor activity, and thought processes Eyes -pale conjunctiva. Chest - clear to auscultation, no wheezes, rales or rhonchi, symmetric air entry Heart - normal rate, regular rhythm, normal S1, S2, no murmurs, rubs, clicks or gallops      Latest Ref Rng & Units 04/19/2022   11:17 AM 02/10/2022    6:32 PM 10/10/2021    1:25 PM  CMP  Glucose 70 - 99 mg/dL 91  161  94   BUN 6 - 24 mg/dL 10  9  8    Creatinine 0.57 - 1.00 mg/dL 0.96  0.45  4.09   Sodium 134 - 144 mmol/L 138  136  132   Potassium 3.5 - 5.2 mmol/L 4.4  3.9  4.2    Chloride 96 - 106 mmol/L 105  106  104   CO2 20 - 29 mmol/L 19  24  22    Calcium 8.7 - 10.2 mg/dL 8.8  9.1  8.8   Total Protein 6.5 - 8.1 g/dL  7.9    Total Bilirubin 0.3 - 1.2 mg/dL  0.6    Alkaline Phos 38 - 126 U/L  61    AST 15 - 41 U/L  13  ALT 0 - 44 U/L  12     Lipid Panel  No results found for: "CHOL", "TRIG", "HDL", "CHOLHDL", "VLDL", "LDLCALC", "LDLDIRECT"  CBC    Component Value Date/Time   WBC 8.5 10/07/2022 1337   WBC 7.1 02/10/2022 1832   RBC 4.30 10/07/2022 1337   RBC 4.67 02/10/2022 1832   HGB 8.8 (L) 10/07/2022 1337   HCT 30.5 (L) 10/07/2022 1337   PLT 324 10/07/2022 1337   MCV 71 (L) 10/07/2022 1337   MCH 20.5 (L) 10/07/2022 1337   MCH 26.6 02/10/2022 1832   MCHC 28.9 (L) 10/07/2022 1337   MCHC 31.8 02/10/2022 1832   RDW 17.1 (H) 10/07/2022 1337   LYMPHSABS 1.9 02/10/2022 1832   MONOABS 0.5 02/10/2022 1832   EOSABS 0.1 02/10/2022 1832   BASOSABS 0.0 02/10/2022 1832    ASSESSMENT AND PLAN: 1. Iron deficiency anemia due to chronic blood loss Continue iron supplement. - CBC  2. Menorrhagia with irregular cycle I recommend that she calls her gynecologist office and schedule a follow-up appointment since bleeding has not slowed down with the pill that she was placed on.  3. Screening for colon cancer We will resubmit referral to gastroenterology for colonoscopy. - Ambulatory referral to Gastroenterology     Patient was given the opportunity to ask questions.  Patient verbalized understanding of the plan and was able to repeat key elements of the plan.   This documentation was completed using Paediatric nurse.  Any transcriptional errors are unintentional.  No orders of the defined types were placed in this encounter.    Requested Prescriptions    No prescriptions requested or ordered in this encounter    No follow-ups on file.  Jonah Blue, MD, FACP

## 2023-02-02 ENCOUNTER — Ambulatory Visit: Payer: Managed Care, Other (non HMO) | Admitting: Internal Medicine

## 2023-02-02 LAB — CBC
Hematocrit: 34.8 % (ref 34.0–46.6)
Hemoglobin: 11.5 g/dL (ref 11.1–15.9)
MCH: 29.1 pg (ref 26.6–33.0)
MCHC: 33 g/dL (ref 31.5–35.7)
MCV: 88 fL (ref 79–97)
Platelets: 289 10*3/uL (ref 150–450)
RBC: 3.95 x10E6/uL (ref 3.77–5.28)
RDW: 14.3 % (ref 11.7–15.4)
WBC: 4.9 10*3/uL (ref 3.4–10.8)

## 2023-03-31 ENCOUNTER — Other Ambulatory Visit: Payer: Self-pay

## 2023-04-27 ENCOUNTER — Other Ambulatory Visit: Payer: Self-pay

## 2023-05-23 ENCOUNTER — Other Ambulatory Visit: Payer: Self-pay

## 2023-05-23 ENCOUNTER — Other Ambulatory Visit: Payer: Self-pay | Admitting: Internal Medicine

## 2023-05-23 DIAGNOSIS — I2699 Other pulmonary embolism without acute cor pulmonale: Secondary | ICD-10-CM

## 2023-05-23 MED ORDER — FERROUS SULFATE 325 (65 FE) MG PO TABS
325.0000 mg | ORAL_TABLET | Freq: Every day | ORAL | 1 refills | Status: DC
  Filled 2023-05-23: qty 100, 100d supply, fill #0
  Filled 2023-09-02: qty 100, 100d supply, fill #1

## 2023-05-23 MED ORDER — APIXABAN 5 MG PO TABS
5.0000 mg | ORAL_TABLET | Freq: Two times a day (BID) | ORAL | 5 refills | Status: AC
  Filled 2023-05-23: qty 60, 30d supply, fill #0
  Filled 2023-06-29: qty 60, 30d supply, fill #1
  Filled 2023-08-17: qty 60, 30d supply, fill #2
  Filled 2023-09-16: qty 60, 30d supply, fill #3
  Filled 2023-10-13 (×2): qty 60, 30d supply, fill #4

## 2023-05-24 ENCOUNTER — Other Ambulatory Visit: Payer: Self-pay

## 2023-05-30 ENCOUNTER — Other Ambulatory Visit: Payer: Self-pay

## 2023-06-14 NOTE — Telephone Encounter (Signed)
Error

## 2023-07-04 ENCOUNTER — Other Ambulatory Visit: Payer: Self-pay

## 2023-07-06 ENCOUNTER — Other Ambulatory Visit: Payer: Self-pay

## 2023-08-22 ENCOUNTER — Other Ambulatory Visit: Payer: Self-pay

## 2023-09-06 ENCOUNTER — Other Ambulatory Visit: Payer: Self-pay

## 2023-09-18 ENCOUNTER — Other Ambulatory Visit: Payer: Self-pay

## 2023-09-19 ENCOUNTER — Other Ambulatory Visit: Payer: Self-pay

## 2023-10-13 ENCOUNTER — Other Ambulatory Visit: Payer: Self-pay

## 2023-10-23 ENCOUNTER — Other Ambulatory Visit: Payer: Self-pay

## 2023-10-24 ENCOUNTER — Other Ambulatory Visit: Payer: Self-pay

## 2023-11-03 ENCOUNTER — Other Ambulatory Visit: Payer: Self-pay

## 2023-11-08 ENCOUNTER — Encounter (HOSPITAL_BASED_OUTPATIENT_CLINIC_OR_DEPARTMENT_OTHER): Payer: Self-pay | Admitting: Emergency Medicine

## 2023-11-08 ENCOUNTER — Emergency Department (HOSPITAL_BASED_OUTPATIENT_CLINIC_OR_DEPARTMENT_OTHER): Payer: Self-pay

## 2023-11-08 ENCOUNTER — Other Ambulatory Visit: Payer: Self-pay

## 2023-11-08 ENCOUNTER — Emergency Department (HOSPITAL_BASED_OUTPATIENT_CLINIC_OR_DEPARTMENT_OTHER)
Admission: EM | Admit: 2023-11-08 | Discharge: 2023-11-08 | Disposition: A | Discharge status: Home / Self Care | Payer: Self-pay | Attending: Emergency Medicine | Admitting: Emergency Medicine

## 2023-11-08 DIAGNOSIS — Z7901 Long term (current) use of anticoagulants: Secondary | ICD-10-CM | POA: Insufficient documentation

## 2023-11-08 DIAGNOSIS — J40 Bronchitis, not specified as acute or chronic: Secondary | ICD-10-CM

## 2023-11-08 DIAGNOSIS — R0602 Shortness of breath: Secondary | ICD-10-CM

## 2023-11-08 DIAGNOSIS — R42 Dizziness and giddiness: Secondary | ICD-10-CM | POA: Insufficient documentation

## 2023-11-08 LAB — CBC
HCT: 30.9 % — ABNORMAL LOW (ref 36.0–46.0)
Hemoglobin: 10 g/dL — ABNORMAL LOW (ref 12.0–15.0)
MCH: 28.7 pg (ref 26.0–34.0)
MCHC: 32.4 g/dL (ref 30.0–36.0)
MCV: 88.8 fL (ref 80.0–100.0)
Platelets: 338 10*3/uL (ref 150–400)
RBC: 3.48 MIL/uL — ABNORMAL LOW (ref 3.87–5.11)
RDW: 13.2 % (ref 11.5–15.5)
WBC: 5.6 10*3/uL (ref 4.0–10.5)
nRBC: 0 % (ref 0.0–0.2)

## 2023-11-08 LAB — BASIC METABOLIC PANEL
Anion gap: 11 (ref 5–15)
BUN: 7 mg/dL (ref 6–20)
CO2: 20 mmol/L — ABNORMAL LOW (ref 22–32)
Calcium: 8.2 mg/dL — ABNORMAL LOW (ref 8.9–10.3)
Chloride: 101 mmol/L (ref 98–111)
Creatinine, Ser: 0.82 mg/dL (ref 0.44–1.00)
GFR, Estimated: >60 mL/min (ref >60)
Glucose, Bld: 100 mg/dL — ABNORMAL HIGH (ref 70–99)
Potassium: 4 mmol/L (ref 3.5–5.1)
Sodium: 132 mmol/L — ABNORMAL LOW (ref 135–145)

## 2023-11-08 LAB — RESP PANEL BY RT-PCR (RSV, FLU A&B, COVID)  RVPGX2
Influenza A by PCR: NEGATIVE
Influenza B by PCR: NEGATIVE
Resp Syncytial Virus by PCR: NEGATIVE
SARS Coronavirus 2 by RT PCR: NEGATIVE

## 2023-11-08 MED ORDER — GUAIFENESIN-DM 100-10 MG/5ML PO SYRP: 15.0000 mL | ORAL_SOLUTION | Freq: Four times a day (QID) | ORAL | 0 refills | Status: DC | PRN

## 2023-11-08 MED ORDER — IPRATROPIUM-ALBUTEROL 0.5-2.5 (3) MG/3ML IN SOLN
3.0000 mL | Freq: Once | RESPIRATORY_TRACT | Status: AC
  Administered 2023-11-08: 3 mL via RESPIRATORY_TRACT
  Filled 2023-11-08: qty 3

## 2023-11-08 MED ORDER — IOHEXOL 350 MG/ML SOLN
75.0000 mL | Freq: Once | INTRAVENOUS | Status: AC | PRN
  Administered 2023-11-08: 75 mL via INTRAVENOUS

## 2023-11-08 MED ORDER — ALBUTEROL SULFATE HFA 108 (90 BASE) MCG/ACT IN AERS
2.0000 | INHALATION_SPRAY | Freq: Once | RESPIRATORY_TRACT | Status: AC
  Administered 2023-11-08: 2 via RESPIRATORY_TRACT
  Filled 2023-11-08: qty 6.7

## 2023-11-08 NOTE — ED Triage Notes (Addendum)
 Pt presents with complains of SOB. Pt reports dry, non productive cough and SOBX 2 days. Also endorses dizziness. Reports hx of PE.   Reports today's symptoms similar to when she had PE.

## 2023-11-08 NOTE — ED Provider Notes (Signed)
 [place] [facility] Provider Note   CSN: [id number] Arrival date & time: 11/08/23  1550     History {Add pertinent medical, surgical, social history, OB history to HPI:1} Chief Complaint  Patient presents with   Shortness of Breath   Dizziness    Carly Garrison is a 48 y.o. female.  Patient is a 48 year old female with past medical history of pulmonary emboli on Eliquis and anemia presenting for complaints of shortness of breath.  Patient states over the last several days she has had a difficulty breathing, difficulty taking in deep breath, and dizziness.  She admits to cough that only occurs after trying to take in a deep breath.  She denies any subjective chills, fevers, nasal congestion, sore throat, or sick contacts.  Denies nausea, vomiting, diarrhea.  She states she has been compliant on her Eliquis and has not missed any doses.  The history is provided by the patient. No language interpreter was used.  Shortness of Breath Associated symptoms: no abdominal pain, no chest pain, no cough, no ear pain, no fever, no rash, no sore throat and no vomiting   Dizziness Associated symptoms: shortness of breath   Associated symptoms: no chest pain, no palpitations and no vomiting        Home Medications Prior to Admission medications   Medication Sig Start Date End Date Taking? Authorizing Provider  apixaban (ELIQUIS) 5 MG TABS tablet Take 1 tablet (5 mg total) by mouth 2 (two) times daily. 05/23/23   Marcine Matar, MD  Drospirenone (SLYND) 4 MG TABS Take 1 tablet (4 mg total) by mouth daily. 11/29/22   Chrzanowski, Jami B, NP  ferrous sulfate (FEROSUL) 325 (65 FE) MG tablet Take 1 tablet (325 mg total) by mouth daily with breakfast. 05/23/23   Marcine Matar, MD  traMADol (ULTRAM) 50 MG tablet Take 1 tablet (50 mg total) by mouth every 8 (eight) hours as needed. To use for menstrual cramps 06/02/22   Marcine Matar, MD      Allergies     Patient has no known allergies.    Review of Systems   Review of Systems  Constitutional:  Negative for chills and fever.  HENT:  Negative for ear pain and sore throat.   Eyes:  Negative for pain and visual disturbance.  Respiratory:  Positive for shortness of breath. Negative for cough.   Cardiovascular:  Negative for chest pain and palpitations.  Gastrointestinal:  Negative for abdominal pain and vomiting.  Genitourinary:  Negative for dysuria and hematuria.  Musculoskeletal:  Negative for arthralgias and back pain.  Skin:  Negative for color change and rash.  Neurological:  Positive for dizziness. Negative for seizures and syncope.  All other systems reviewed and are negative.   Physical Exam Updated Vital Signs BP (!) 135/98 (BP Location: Left Arm)   Pulse 95   Temp (!) 100.9 F (38.3 C)   Resp 18   Ht 5\' 6"  (1.676 m)   Wt 93 kg   SpO2 100%   BMI 33.09 kg/m  Physical Exam Vitals and nursing note reviewed.  Constitutional:      General: She is not in acute distress.    Appearance: She is well-developed.  HENT:     Head: Normocephalic and atraumatic.  Eyes:     Conjunctiva/sclera: Conjunctivae normal.  Cardiovascular:     Rate and Rhythm: Normal rate and regular rhythm.     Heart sounds: No murmur heard. Pulmonary:  Effort: Pulmonary effort is normal. No respiratory distress.     Breath sounds: Normal breath sounds.  Abdominal:     Palpations: Abdomen is soft.     Tenderness: There is no abdominal tenderness.  Musculoskeletal:        General: No swelling.     Cervical back: Neck supple.  Skin:    General: Skin is warm and dry.     Capillary Refill: Capillary refill takes less than 2 seconds.  Neurological:     Mental Status: She is alert.  Psychiatric:        Mood and Affect: Mood normal.     ED Results / Procedures / Treatments   Labs (all labs ordered are listed, but only abnormal results are displayed) Labs Reviewed  BASIC METABOLIC PANEL -  Abnormal; Notable for the following components:      Result Value   Sodium 132 (*)    CO2 20 (*)    Glucose, Bld 100 (*)    Calcium 8.2 (*)    All other components within normal limits  CBC - Abnormal; Notable for the following components:   RBC 3.48 (*)    Hemoglobin 10.0 (*)    HCT 30.9 (*)    All other components within normal limits  RESP PANEL BY RT-PCR (RSV, FLU A&B, COVID)  RVPGX2  PREGNANCY, URINE    EKG None  Radiology DG Chest 2 View Result Date: 11/08/2023 CLINICAL DATA:  Shortness of breath, cough. EXAM: CHEST - 2 VIEW COMPARISON:  February 10, 2022. FINDINGS: The heart size and mediastinal contours are within normal limits. Both lungs are clear. The visualized skeletal structures are unremarkable. IMPRESSION: No active cardiopulmonary disease. Electronically Signed   By: Lupita Raider M.D.   On: 11/08/2023 17:25    Procedures Procedures  {Document cardiac monitor, telemetry assessment procedure when appropriate:1}  Medications Ordered in ED Medications - No data to display  ED Course/ Medical Decision Making/ A&P   {   Click here for ABCD2, HEART and other calculatorsREFRESH Note before signing :1}                              Medical Decision Making Amount and/or Complexity of Data Reviewed Labs: ordered. Radiology: ordered.   48 year old female with past medical history of pulmonary emboli on Eliquis and anemia presenting for complaints of shortness of breath.  Is alert and oriented x 3, no acute distress, temperature 100.9, with otherwise stable vital signs.  Physical exam demonstrates equal bilateral breath sounds with no adventitious lung sounds.  EKG  X-ray demonstrates no acute process.  No focal pneumonias.  No pneumothorax.  No pleural effusions.  COVID, flu, RSV negative.  No leukocytosis or signs or symptoms of sepsis.  Concern for pulmonary embolism and failed Eliquis therapy.  CT PE pending.  {Document critical care time when  appropriate:1} {Document review of labs and clinical decision tools ie heart score, Chads2Vasc2 etc:1}  {Document your independent review of radiology images, and any outside records:1} {Document your discussion with family members, caretakers, and with consultants:1} {Document social determinants of health affecting pt's care:1} {Document your decision making why or why not admission, treatments were needed:1} Final Clinical Impression(s) / ED Diagnoses Final diagnoses:  None    Rx / DC Orders ED Discharge Orders     None

## 2023-11-10 ENCOUNTER — Other Ambulatory Visit: Payer: Self-pay

## 2023-11-17 ENCOUNTER — Other Ambulatory Visit: Payer: Self-pay

## 2023-11-24 ENCOUNTER — Other Ambulatory Visit: Payer: Self-pay

## 2024-01-10 ENCOUNTER — Other Ambulatory Visit: Payer: Self-pay | Admitting: Internal Medicine

## 2024-01-10 ENCOUNTER — Other Ambulatory Visit: Payer: Self-pay

## 2024-01-10 ENCOUNTER — Ambulatory Visit (INDEPENDENT_AMBULATORY_CARE_PROVIDER_SITE_OTHER): Admitting: Nurse Practitioner

## 2024-01-10 ENCOUNTER — Encounter: Payer: Self-pay | Admitting: Nurse Practitioner

## 2024-01-10 VITALS — BP 130/84 | HR 71 | Temp 99.1°F | Wt 208.8 lb

## 2024-01-10 DIAGNOSIS — D219 Benign neoplasm of connective and other soft tissue, unspecified: Secondary | ICD-10-CM

## 2024-01-10 DIAGNOSIS — R102 Pelvic and perineal pain: Secondary | ICD-10-CM

## 2024-01-10 DIAGNOSIS — D500 Iron deficiency anemia secondary to blood loss (chronic): Secondary | ICD-10-CM | POA: Diagnosis not present

## 2024-01-10 DIAGNOSIS — N939 Abnormal uterine and vaginal bleeding, unspecified: Secondary | ICD-10-CM

## 2024-01-10 DIAGNOSIS — N946 Dysmenorrhea, unspecified: Secondary | ICD-10-CM

## 2024-01-10 MED ORDER — NORETHINDRONE 0.35 MG PO TABS
1.0000 | ORAL_TABLET | Freq: Every day | ORAL | 0 refills | Status: AC
  Filled 2024-01-10: qty 84, 84d supply, fill #0

## 2024-01-10 NOTE — Progress Notes (Signed)
   Acute Office Visit  Subjective:    Patient ID: Carly Garrison, female    DOB: Jan 03, 1976, 48 y.o.   MRN: [id number]   HPI 48 y.o. Carly Garrison presents today for irregular bleeding since starting Eliquis  in 2023 for recurrent PE. Has 2 periods per month, each lasting 7 days. Bleeding is heavy with severe dysmenorrhea. Also has pelvic pain when not on menses, described as pressure/pulling. Tried Slynd  but had bleeding for 3 weeks, so she discontinued. H/O anemia, taking iron supplement daily. Feels lightheaded and weak due to bleeding. Hgb 30 October 2023. Multiple fibroids seen on US  in February 2024 with largest measuring 9.4 cm.   Patient's last menstrual period was 12/31/2023 (exact date).    Review of Systems  Constitutional:  Positive for fatigue.  Genitourinary:  Positive for menstrual problem and pelvic pain.  Neurological:  Positive for weakness and light-headedness.       Objective:     Physical Exam Constitutional:      Appearance: Normal appearance.   GU: Not indicated  BP 130/84 (BP Location: Right Arm, Patient Position: Sitting, Cuff Size: Large)   Pulse 71   Temp 99.1 F (37.3 C) (Oral)   Wt 208 lb 12.8 oz (94.7 kg)   LMP 12/31/2023 (Exact Date)   SpO2 99%   BMI 33.70 kg/m  Wt Readings from Last 3 Encounters:  01/10/24 208 lb 12.8 oz (94.7 kg)  11/08/23 205 lb (93 kg)  02/01/23 212 lb (96.2 kg)        Neg UPT Neg UA  Assessment & Plan:   Problem List Items Addressed This Visit       Other   Iron deficiency anemia   Relevant Orders   CBC with Differential/Platelet   Iron, TIBC and Ferritin Panel   Other Visit Diagnoses       Abnormal vaginal bleeding    -  Primary   Relevant Medications   norethindrone (MICRONOR) 0.35 MG tablet   Other Relevant Orders   Pregnancy, urine   US  PELVIS TRANSVAGINAL NON-OB (TV ONLY)     Pelvic pain       Relevant Orders   Urinalysis,Complete w/RFL Culture   US  PELVIS TRANSVAGINAL NON-OB (TV ONLY)     Fibroids        Relevant Orders   US  PELVIS TRANSVAGINAL NON-OB (TV ONLY)     Dysmenorrhea       Relevant Medications   norethindrone (MICRONOR) 0.35 MG tablet   Other Relevant Orders   US  PELVIS TRANSVAGINAL NON-OB (TV ONLY)      Plan: Discussed management options but limited due to history of PE and fibroids. IUD not recommended due to fibroids, bled continuously on Slynd , Myfembree/Oriahnn contraindicated. Interested in surgery. Will schedule ultrasound and surgical consult. Micronor in the interim. CBC, iron panel today. Recommend increasing iron supplement to twice a day.     Andee Bamberger DNP, 9:57 AM 01/10/2024

## 2024-01-11 ENCOUNTER — Ambulatory Visit: Payer: Self-pay | Admitting: Nurse Practitioner

## 2024-01-11 ENCOUNTER — Other Ambulatory Visit: Payer: Self-pay

## 2024-01-11 ENCOUNTER — Other Ambulatory Visit: Payer: Self-pay | Admitting: Nurse Practitioner

## 2024-01-11 DIAGNOSIS — D500 Iron deficiency anemia secondary to blood loss (chronic): Secondary | ICD-10-CM

## 2024-01-11 LAB — IRON,TIBC AND FERRITIN PANEL
%SAT: 11 % — ABNORMAL LOW (ref 16–45)
Ferritin: 9 ng/mL — ABNORMAL LOW (ref 16–232)
Iron: 34 ug/dL — ABNORMAL LOW (ref 40–190)
TIBC: 306 ug/dL (ref 250–450)

## 2024-01-11 LAB — CBC WITH DIFFERENTIAL/PLATELET
Absolute Lymphocytes: 2013 {cells}/uL (ref 850–3900)
Absolute Monocytes: 488 {cells}/uL (ref 200–950)
Basophils Absolute: 43 {cells}/uL (ref 0–200)
Basophils Relative: 0.7 %
Eosinophils Absolute: 110 {cells}/uL (ref 15–500)
Eosinophils Relative: 1.8 %
HCT: 33.3 % — ABNORMAL LOW (ref 35.0–45.0)
Hemoglobin: 10.2 g/dL — ABNORMAL LOW (ref 11.7–15.5)
MCH: 24.6 pg — ABNORMAL LOW (ref 27.0–33.0)
MCHC: 30.6 g/dL — ABNORMAL LOW (ref 32.0–36.0)
MCV: 80.2 fL (ref 80.0–100.0)
MPV: 10.6 fL (ref 7.5–12.5)
Monocytes Relative: 8 %
Neutro Abs: 3447 {cells}/uL (ref 1500–7800)
Neutrophils Relative %: 56.5 %
Platelets: 392 10*3/uL (ref 140–400)
RBC: 4.15 10*6/uL (ref 3.80–5.10)
RDW: 14.9 % (ref 11.0–15.0)
Total Lymphocyte: 33 %
WBC: 6.1 10*3/uL (ref 3.8–10.8)

## 2024-01-11 MED ORDER — FERROUS SULFATE 325 (65 FE) MG PO TABS
325.0000 mg | ORAL_TABLET | Freq: Two times a day (BID) | ORAL | 0 refills | Status: AC
Start: 2024-01-11 — End: ?

## 2024-01-12 LAB — URINALYSIS, COMPLETE W/RFL CULTURE
Bilirubin Urine: NEGATIVE
Glucose, UA: NEGATIVE
Hgb urine dipstick: NEGATIVE
Hyaline Cast: NONE SEEN /LPF
Ketones, ur: NEGATIVE
Nitrites, Initial: NEGATIVE
Protein, ur: NEGATIVE
RBC / HPF: NONE SEEN /HPF (ref 0–2)
Specific Gravity, Urine: 1.015 (ref 1.001–1.035)
pH: 7 (ref 5.0–8.0)

## 2024-01-12 LAB — CULTURE INDICATED

## 2024-01-12 LAB — URINE CULTURE
MICRO NUMBER:: 16484180
Result:: NO GROWTH
SPECIMEN QUALITY:: ADEQUATE

## 2024-01-12 LAB — PREGNANCY, URINE: Preg Test, Ur: NEGATIVE

## 2024-01-25 ENCOUNTER — Encounter: Payer: Self-pay | Admitting: Obstetrics and Gynecology

## 2024-01-25 ENCOUNTER — Other Ambulatory Visit: Payer: Self-pay | Admitting: Obstetrics and Gynecology

## 2024-01-25 ENCOUNTER — Ambulatory Visit (INDEPENDENT_AMBULATORY_CARE_PROVIDER_SITE_OTHER)

## 2024-01-25 ENCOUNTER — Ambulatory Visit (INDEPENDENT_AMBULATORY_CARE_PROVIDER_SITE_OTHER): Admitting: Obstetrics and Gynecology

## 2024-01-25 VITALS — BP 124/70 | HR 70 | Wt 212.0 lb

## 2024-01-25 DIAGNOSIS — R102 Pelvic and perineal pain unspecified side: Secondary | ICD-10-CM

## 2024-01-25 DIAGNOSIS — D219 Benign neoplasm of connective and other soft tissue, unspecified: Secondary | ICD-10-CM

## 2024-01-25 DIAGNOSIS — D500 Iron deficiency anemia secondary to blood loss (chronic): Secondary | ICD-10-CM

## 2024-01-25 DIAGNOSIS — N946 Dysmenorrhea, unspecified: Secondary | ICD-10-CM | POA: Diagnosis not present

## 2024-01-25 DIAGNOSIS — I2699 Other pulmonary embolism without acute cor pulmonale: Secondary | ICD-10-CM

## 2024-01-25 DIAGNOSIS — N939 Abnormal uterine and vaginal bleeding, unspecified: Secondary | ICD-10-CM | POA: Insufficient documentation

## 2024-01-25 DIAGNOSIS — N920 Excessive and frequent menstruation with regular cycle: Secondary | ICD-10-CM | POA: Insufficient documentation

## 2024-01-25 NOTE — Progress Notes (Signed)
 48 y.o. 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 Married. Laid off- company shut down.  Patient's last menstrual period was 01/17/2024 (approximate). Period Duration (Days): 5 Period Pattern: Regular Menstrual Flow: Heavy Menstrual Control: Hospital pad Dysmenorrhea: (!) Severe  On 01/10/24, she was T.Lajuana Pilar, NP and reported: Has 2 periods per month, each lasting 7 days. Bleeding is heavy with severe dysmenorrhea. Also has pelvic pain when not on menses, described as pressure/pulling. Tried Slynd  but had bleeding for 3 weeks, so she discontinued. H/O anemia, taking iron supplement daily. Feels lightheaded and weak due to bleeding.   01/10/24 Hgb 10.2 Switched to micronor  on 01/10/24 She reports she has completed childbearing and she would like to discuss surgical options.  Repeat Protein S, C, and lupus anticoagulant- never resulted from 2022. Remainder of hypercoagulable w/u negative. Birth control: POP  GYN HISTORY: No significant history  OB History  Gravida Para Term Preterm AB Living  4 3    4   SAB IAB Ectopic Multiple Live Births          # Outcome Date GA Lbr Len/2nd Weight Sex Type Anes PTL Lv  4 Gravida           3 Para           2 Para           1 Para            Past Medical History:  Diagnosis Date   Anemia    Pulmonary embolism (HCC)    History reviewed. No pertinent surgical history. Current Outpatient Medications on File Prior to Visit  Medication Sig Dispense Refill   apixaban  (ELIQUIS ) 5 MG TABS tablet Take 1 tablet (5 mg total) by mouth 2 (two) times daily. 60 tablet 5   ferrous sulfate  (FEROSUL) 325 (65 FE) MG tablet Take 1 tablet (325 mg total) by mouth 2 (two) times daily with a meal. 180 tablet 0   norethindrone  (MICRONOR ) 0.35 MG tablet Take 1 tablet (0.35 mg total) by mouth daily. 84 tablet 0   No current facility-administered medications on file prior to visit.   No Known Allergies     PE Today's Vitals   01/25/24 1552  BP: 124/70  Pulse: 70  SpO2: 99%  Weight: 212 lb (96.2 kg)   Body mass index is 34.22 kg/m.  Physical Exam Vitals reviewed.  Constitutional:      General: She is not in acute distress.    Appearance: Normal appearance.  HENT:     Head: Normocephalic and atraumatic.     Nose: Nose normal.  Eyes:     Extraocular Movements: Extraocular movements intact.     Conjunctiva/sclera: Conjunctivae normal.  Pulmonary:     Effort: Pulmonary effort is normal.  Musculoskeletal:        General: Normal range of motion.     Cervical back: Normal range of motion.  Neurological:     General: No focal deficit present.     Mental Status: She is alert.  Psychiatric:        Mood and Affect: Mood normal.        Behavior: Behavior normal.     01/25/24 TVUS: Indications: Abnormal uterine bleeding, fibroids  Findings:   Uterus: 15.5 x 12.1 x 9.8 cm, anteverted.  Multiple fibroids noted. Fibroids: 1-9.4 x 7.9 cm, intramural 2-2.8 x 1.9 cm, intramural 3-2.8 x 3.4 cm, intramural 4-5.5 x 3.8 cm, subserosal 5-3.6 x  3.6 cm, intramural 6-5.2 x 3.4 cm, subserosal Endometrial thickness: Obscured Left ovary: Not visualized Right ovary: 3.0 x 1.7 x 1.6 cm, normal-appearing No free fluid.  Impression:  Multifibroid uterus with intramural and subserosal fibroids.  Romaine Closs, MD    Assessment and Plan:        Abnormal uterine bleeding (AUB) Fibroids Dysmenorrhea Assessment & Plan: Patient presents with multifactorial AUB, known fibroids and on anticoagulation for recurrent PE. Hgb 10.2 She started taking micronor  01/10/24. TVUS reviewed with patient today revealing 9 cm intramural fibroid, two 5 cm fibroids and 3 other fibroids.  Reviewed hormonal management, which is limited to progestin therapies- POP, Mirena- and lupron. Also reviewed interventional procedures like [place] and Sonata and surgical management including myomectomy and  hysterectomy.  She will return for PAP and EMB, will discuss further management at that time. -     Endometrial biopsy; Future  Iron deficiency anemia due to chronic blood loss Continue PO iron  Recurrent pulmonary emboli (HCC) -     Protein C, total; Future -     Protein S activity; Future -     Protein S, total; Future -     Protein C activity; Future -     Lupus Anticoagulant Eval w/Reflex; Future  Protein C and LA should not be affected by eliquis  use. Lab to adjust analysis based on provided information.  Romaine Closs, MD

## 2024-01-25 NOTE — Assessment & Plan Note (Signed)
 Patient presents with multifactorial AUB, known fibroids and on anticoagulation for recurrent PE. Hgb 10.2 She started taking micronor  01/10/24. TVUS reviewed with patient today revealing 9 cm intramural fibroid, two 5 cm fibroids and 3 other fibroids.  Reviewed hormonal management, which is limited to progestin therapies- POP, Mirena- and lupron. Also reviewed interventional procedures like [place] and Sonata and surgical management including myomectomy and hysterectomy.  She will return for PAP and EMB, will discuss further management at that time.

## 2024-03-04 ENCOUNTER — Ambulatory Visit: Admitting: Obstetrics and Gynecology

## 2024-04-03 ENCOUNTER — Ambulatory Visit: Admitting: Obstetrics and Gynecology

## 2024-05-01 ENCOUNTER — Ambulatory Visit: Admitting: Obstetrics and Gynecology

## 2024-05-21 ENCOUNTER — Encounter: Payer: Self-pay | Admitting: Obstetrics and Gynecology

## 2024-06-03 ENCOUNTER — Telehealth: Payer: Self-pay | Admitting: Obstetrics and Gynecology

## 2024-06-03 NOTE — Telephone Encounter (Signed)
 Patient has no show twice and cancelled once her endo biopsy appointment. Two message were left and letter mailed asking patient to call and reschedule her missed appointment; patient has not called back.

## 2024-06-10 NOTE — Telephone Encounter (Signed)
 Call placed to patient, left detailed message, ok per dpr. Advise f/u on recommended EMB, return call to [phone], opt 4 to further discuss scheduling and/or barriers to scheduling.

## 2024-07-09 NOTE — Telephone Encounter (Signed)
 No response from patient.   Letter pended. Copy printed and to Dr. Dallie to review to be signed and mailed to address on file.

## 2024-08-23 ENCOUNTER — Other Ambulatory Visit: Payer: Self-pay

## 2024-09-05 ENCOUNTER — Other Ambulatory Visit: Payer: Self-pay

## 2024-09-23 ENCOUNTER — Other Ambulatory Visit: Payer: Self-pay
